# Patient Record
Sex: Female | Born: 1955 | Race: White | Hispanic: Yes | Marital: Married | Smoking: Never smoker
Health system: Southern US, Community
[De-identification: ages and names within clinical notes are randomized; demographics above are authoritative.]

## PROBLEM LIST (undated history)

## (undated) DIAGNOSIS — I10 Essential (primary) hypertension: Secondary | ICD-10-CM

## (undated) HISTORY — PX: CHOLECYSTECTOMY: SHX55

---

## 2003-11-06 ENCOUNTER — Ambulatory Visit: Payer: Self-pay | Admitting: Family Medicine

## 2005-03-08 ENCOUNTER — Ambulatory Visit: Payer: Self-pay | Admitting: Family Medicine

## 2006-04-05 ENCOUNTER — Ambulatory Visit: Payer: Self-pay | Admitting: Family Medicine

## 2007-09-26 ENCOUNTER — Ambulatory Visit: Payer: Self-pay | Admitting: Unknown Physician Specialty

## 2007-10-01 ENCOUNTER — Ambulatory Visit: Payer: Self-pay | Admitting: Surgery

## 2007-10-03 ENCOUNTER — Ambulatory Visit: Payer: Self-pay | Admitting: Surgery

## 2009-02-08 ENCOUNTER — Emergency Department: Payer: Self-pay | Admitting: Internal Medicine

## 2009-03-10 ENCOUNTER — Ambulatory Visit: Payer: Self-pay | Admitting: Family Medicine

## 2009-11-19 ENCOUNTER — Ambulatory Visit: Payer: Self-pay | Admitting: Family Medicine

## 2010-12-02 ENCOUNTER — Emergency Department: Payer: Self-pay | Admitting: Emergency Medicine

## 2010-12-20 ENCOUNTER — Ambulatory Visit: Payer: Self-pay | Admitting: Family Medicine

## 2011-07-18 ENCOUNTER — Ambulatory Visit: Payer: Self-pay | Admitting: Primary Care

## 2011-08-09 ENCOUNTER — Ambulatory Visit: Payer: Self-pay | Admitting: Orthopedic Surgery

## 2011-08-09 LAB — BASIC METABOLIC PANEL
BUN: 16 mg/dL (ref 7–18)
Creatinine: 0.71 mg/dL (ref 0.60–1.30)
EGFR (Non-African Amer.): >60
Glucose: 83 mg/dL (ref 65–99)
Osmolality: 285 (ref 275–301)
Potassium: 3.6 mmol/L (ref 3.5–5.1)
Sodium: 143 mmol/L (ref 136–145)

## 2011-08-11 ENCOUNTER — Ambulatory Visit: Payer: Self-pay | Admitting: Orthopedic Surgery

## 2011-12-21 ENCOUNTER — Ambulatory Visit: Payer: Self-pay | Admitting: Urology

## 2012-03-06 ENCOUNTER — Ambulatory Visit: Payer: Self-pay | Admitting: Primary Care

## 2012-05-04 ENCOUNTER — Emergency Department: Payer: Self-pay | Admitting: Emergency Medicine

## 2012-05-04 LAB — COMPREHENSIVE METABOLIC PANEL
Anion Gap: 9 (ref 7–16)
Bilirubin,Total: 0.3 mg/dL (ref 0.2–1.0)
Calcium, Total: 8.4 mg/dL — ABNORMAL LOW (ref 8.5–10.1)
Creatinine: 0.71 mg/dL (ref 0.60–1.30)
EGFR (African American): >60
EGFR (Non-African Amer.): >60
Glucose: 158 mg/dL — ABNORMAL HIGH (ref 65–99)
SGOT(AST): 32 U/L (ref 15–37)
Total Protein: 7.7 g/dL (ref 6.4–8.2)

## 2012-05-04 LAB — URINALYSIS, COMPLETE
Bilirubin,UR: NEGATIVE
Glucose,UR: NEGATIVE mg/dL (ref 0–75)
Ketone: NEGATIVE
Nitrite: NEGATIVE
Ph: 6 (ref 4.5–8.0)
RBC,UR: 1 /HPF (ref 0–5)
Specific Gravity: 1.014 (ref 1.003–1.030)
Squamous Epithelial: NONE SEEN

## 2012-05-04 LAB — CBC
HCT: 37.8 % (ref 35.0–47.0)
HGB: 12.6 g/dL (ref 12.0–16.0)
MCH: 28.7 pg (ref 26.0–34.0)
MCHC: 33.4 g/dL (ref 32.0–36.0)
Platelet: 280 10*3/uL (ref 150–440)
RBC: 4.39 10*6/uL (ref 3.80–5.20)

## 2012-07-23 ENCOUNTER — Ambulatory Visit: Payer: Self-pay | Admitting: Internal Medicine

## 2012-11-15 ENCOUNTER — Ambulatory Visit: Payer: Self-pay

## 2013-03-07 ENCOUNTER — Ambulatory Visit: Payer: Self-pay | Admitting: Internal Medicine

## 2014-04-02 ENCOUNTER — Emergency Department: Payer: Self-pay | Admitting: Emergency Medicine

## 2014-04-09 ENCOUNTER — Ambulatory Visit: Payer: Self-pay | Admitting: Internal Medicine

## 2014-05-25 NOTE — Op Note (Signed)
PATIENT NAME:  Abigail Freeman, Abigail Freeman MR#:  [id number] DATE OF BIRTH:  03/28/1955  DATE OF PROCEDURE:  08/11/2011  PREOPERATIVE DIAGNOSIS: Medial and lateral meniscus tears, right knee.   POSTOPERATIVE DIAGNOSIS:  Medial and lateral meniscus tears, right knee.   PROCEDURES:  1. Arthroscopy, right knee.  2. Partial medial and lateral meniscectomies.   SURGEON: Leitha SchullerMichael J. Cadi Rhinehart, MD.   ANESTHESIA: General.   DESCRIPTION OF PROCEDURE: The patient was brought to the Operating Room and after adequate anesthesia was obtained, the right knee was placed in the arthroscopic legholder with tourniquet applied but not required during the procedure. After prepping and draping in the usual sterile fashion, appropriate patient identification and time-out procedures were completed. An inferolateral portal was made and the arthroscope was introduced. Initial inspection revealed relatively normal appearing patellofemoral articulation coming around medially, but there was a small amount of chondromalacia, but minimal, at the patellofemoral joint with good patellofemoral tracking. Along the medial gutter, there were no loose bodies. Coming in the medial compartment, an inferomedial portal was made and a probe introduced. There was a very large flap tear of the posterior third of the meniscus with also a horizontal tear into the body of the posterior third. This was subsequently debrided with a meniscal punch and ArthroCare wand to get back to a stable margin. Articular cartilage showed just some mild fibrillation, but overall intact articular cartilage. The anterior cruciate ligament was intact. Going to the lateral compartment, the lateral compartment appeared normal except for a tear at the anterior horn of the lateral meniscus close to its attachment close to the notch. An ArthroCare wand was used to address this leaving the attachment centrally to prevent instability of the anterior horn of the lateral meniscus. The gutters  were checked and after addressing all meniscal pathology, the knee was thoroughly irrigated. All instrumentation was withdrawn. The wounds were closed in simple interrupted 4-0 nylon single suture to each portal. 20 mL of 0.5% Sensorcaine with epinephrine was infiltrated in the portals. Sterile dressing of Xeroform, 4 x 4's, Webril, and Ace wrap were applied. The patient was sent to the recovery room in stable condition.   ESTIMATED BLOOD LOSS: Minimal.  COMPLICATIONS: None.  SPECIMENS: None. Intraoperative pictures obtained.   ____________________________ Leitha SchullerMichael J. Anhar Mcdermott, MD mjm:ap D: 08/11/2011 20:00:10 ET T: 08/12/2011 09:32:59 ET JOB#: [id number]  cc: Leitha SchullerMichael J. Amala Petion, MD, <Dictator> Leitha SchullerMICHAEL J Shatarra Wehling MD ELECTRONICALLY SIGNED 08/12/2011 10:34

## 2014-09-20 ENCOUNTER — Encounter: Payer: Self-pay | Admitting: Emergency Medicine

## 2014-09-20 ENCOUNTER — Emergency Department
Admission: EM | Admit: 2014-09-20 | Discharge: 2014-09-20 | Disposition: A | Discharge status: Home / Self Care | Payer: BLUE CROSS/BLUE SHIELD | Attending: Emergency Medicine | Admitting: Emergency Medicine

## 2014-09-20 ENCOUNTER — Emergency Department: Payer: BLUE CROSS/BLUE SHIELD

## 2014-09-20 DIAGNOSIS — R0600 Dyspnea, unspecified: Secondary | ICD-10-CM | POA: Insufficient documentation

## 2014-09-20 DIAGNOSIS — I10 Essential (primary) hypertension: Secondary | ICD-10-CM | POA: Diagnosis not present

## 2014-09-20 DIAGNOSIS — F419 Anxiety disorder, unspecified: Secondary | ICD-10-CM | POA: Diagnosis not present

## 2014-09-20 DIAGNOSIS — R0602 Shortness of breath: Secondary | ICD-10-CM | POA: Diagnosis present

## 2014-09-20 HISTORY — DX: Essential (primary) hypertension: I10

## 2014-09-20 NOTE — ED Notes (Signed)
This is not a worker's comp - the pt feels sob - spO2 98%. The cleaning supplies she uses at home is windex and comet cleanerrg

## 2014-09-20 NOTE — ED Provider Notes (Signed)
[facility] Emergency Department Provider Note  ____________________________________________  Time seen: Approximately 2:28 PM  I have reviewed the triage vital signs and the nursing notes.   HISTORY  Chief Complaint Shortness of Breath    HPI Abigail Freeman is a 59 y.o. female via interpreter patient complain of shortness of breath for 5 days. Patient stated this is ongoing intermitting complaint which has not been reported to her family doctor. Patient states she's getting intermittent episodes of dyspnea and increased heart rate. Patient also states this onset seemed be related to the death of her friend's husband last week.Patient state is exertional and nonexertional. 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 No palliative measures for this complaint.  onal.  Past Medical History  Diagnosis Date  . Hypertension     There are no active problems to display for this patient.   Past Surgical History  Procedure Laterality Date  . Cholecystectomy      No current outpatient prescriptions on file.  Allergies Review of patient's allergies indicates no known allergies.  No family history on file.  Social History Social History  Substance Use Topics  . Smoking status: Never Smoker   . Smokeless tobacco: None  . Alcohol Use: No    Review of Systems Constitutional: No fever/chills Eyes: No visual changes. ENT: No sore throat. Cardiovascular: Denies chest pain. Respiratory: Denies shortness of breath. Gastrointestinal: No abdominal pain.  No nausea, no vomiting.  No diarrhea.  No constipation. Genitourinary: Negative for dysuria. Musculoskeletal: Negative for back pain. Skin: Negative for rash. Neurological: Negative for headaches, focal weakness or numbness.  10-point ROS otherwise negative.  ____________________________________________   PHYSICAL EXAM:  VITAL SIGNS: ED Triage Vitals  Enc Vitals Group      BP 09/20/14 1349 166/89 mmHg     Pulse Rate 09/20/14 1349 104     Resp 09/20/14 1349 20     Temp 09/20/14 1349 98.5 F (36.9 C)     Temp Source 09/20/14 1349 Oral     SpO2 09/20/14 1349 98 %     Weight 09/20/14 1349 160 lb (72.576 kg)     Height 09/20/14 1349 5' (1.524 m)     Head Cir --      Peak Flow --      Pain Score --      Pain Loc --      Pain Edu? --      Excl. in [place]? --     Constitutional: Alert and oriented. Well appearing and in no acute distress. Eyes: Conjunctivae are normal. PERRL. EOMI. Head: Atraumatic. Nose: No congestion/rhinnorhea. Mouth/Throat: Mucous membranes are moist.  Oropharynx non-erythematous. Neck: No stridor. No cervical spine tenderness to palpation. Hematological/Lymphatic/Immunilogical: No cervical lymphadenopathy. Cardiovascular: Normal rate, regular rhythm. Grossly normal heart sounds.  Good peripheral circulation. Respiratory: Normal respiratory effort.  No retractions. Lungs CTAB. Gastrointestinal: Soft and nontender. No distention. No abdominal bruits. No CVA tenderness. Musculoskeletal: No lower extremity tenderness nor edema.  No joint effusions. Neurologic:  Normal speech and language. No gross focal neurologic deficits are appreciated. No gait instability. Skin:  Skin is warm, dry and intact. No rash noted. Psychiatric: Mood and affect are normal. Speech and behavior are normal.  ____________________________________________   LABS (all labs ordered are listed, but only abnormal results are displayed)  Labs Reviewed - No data to display ____________________________________________  EKG   ____________________________________________  RADIOLOGY  No acute finding on chest x-ray. ____________________________________________  PROCEDURES  Procedure(s) performed: None  Critical Care performed: No  ____________________________________________   INITIAL IMPRESSION / ASSESSMENT AND PLAN / ED COURSE  Pertinent labs &  imaging results that were available during my care of the patient were reviewed by me and considered in my medical decision making (see chart for details).  Dyspnea secondary to anxiety. Patient advised to follow-up family doctor in the next 3 days for complete evaluation of her complaint. Discussed negative chest x-ray findings today. Patient denies any shortness of breath or feel anxious at this time. ____________________________________________   FINAL CLINICAL IMPRESSION(S) / ED DIAGNOSES  Final diagnoses:  None      Joni Reining, PA-C 09/20/14 1503  Minna Antis, MD 09/20/14 [phone]

## 2014-09-20 NOTE — ED Notes (Signed)
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

## 2015-04-24 ENCOUNTER — Other Ambulatory Visit: Payer: Self-pay | Admitting: Internal Medicine

## 2015-04-24 DIAGNOSIS — Z1231 Encounter for screening mammogram for malignant neoplasm of breast: Secondary | ICD-10-CM

## 2015-05-08 ENCOUNTER — Ambulatory Visit
Admission: RE | Admit: 2015-05-08 | Discharge: 2015-05-08 | Disposition: A | Discharge status: Home / Self Care | Payer: BLUE CROSS/BLUE SHIELD | Source: Ambulatory Visit | Attending: Internal Medicine | Admitting: Internal Medicine

## 2015-05-08 DIAGNOSIS — Z1231 Encounter for screening mammogram for malignant neoplasm of breast: Secondary | ICD-10-CM | POA: Diagnosis present

## 2016-04-21 ENCOUNTER — Other Ambulatory Visit: Payer: Self-pay | Admitting: Internal Medicine

## 2016-04-21 DIAGNOSIS — Z1231 Encounter for screening mammogram for malignant neoplasm of breast: Secondary | ICD-10-CM

## 2016-05-18 ENCOUNTER — Ambulatory Visit
Admission: RE | Admit: 2016-05-18 | Discharge: 2016-05-18 | Disposition: A | Discharge status: Home / Self Care | Payer: BLUE CROSS/BLUE SHIELD | Source: Ambulatory Visit | Attending: Internal Medicine | Admitting: Internal Medicine

## 2016-05-18 DIAGNOSIS — Z1231 Encounter for screening mammogram for malignant neoplasm of breast: Secondary | ICD-10-CM

## 2016-08-18 ENCOUNTER — Other Ambulatory Visit: Payer: Self-pay | Admitting: Internal Medicine

## 2016-08-18 DIAGNOSIS — R739 Hyperglycemia, unspecified: Secondary | ICD-10-CM

## 2016-08-18 DIAGNOSIS — R748 Abnormal levels of other serum enzymes: Secondary | ICD-10-CM

## 2016-09-01 ENCOUNTER — Ambulatory Visit
Admission: RE | Admit: 2016-09-01 | Discharge: 2016-09-01 | Disposition: A | Discharge status: Home / Self Care | Payer: BLUE CROSS/BLUE SHIELD | Source: Ambulatory Visit | Attending: Internal Medicine | Admitting: Internal Medicine

## 2016-09-01 DIAGNOSIS — R748 Abnormal levels of other serum enzymes: Secondary | ICD-10-CM | POA: Diagnosis not present

## 2016-09-01 DIAGNOSIS — R739 Hyperglycemia, unspecified: Secondary | ICD-10-CM | POA: Diagnosis present

## 2016-09-01 MED ORDER — IOPAMIDOL (ISOVUE-300) INJECTION 61%
100.0000 mL | Freq: Once | INTRAVENOUS | Status: AC | PRN
  Administered 2016-09-01: 100 mL via INTRAVENOUS

## 2017-04-17 ENCOUNTER — Other Ambulatory Visit: Payer: Self-pay | Admitting: Internal Medicine

## 2017-04-17 DIAGNOSIS — Z1231 Encounter for screening mammogram for malignant neoplasm of breast: Secondary | ICD-10-CM

## 2017-05-23 ENCOUNTER — Ambulatory Visit
Admission: RE | Admit: 2017-05-23 | Discharge: 2017-05-23 | Disposition: A | Discharge status: Home / Self Care | Payer: BLUE CROSS/BLUE SHIELD | Source: Ambulatory Visit | Attending: Internal Medicine | Admitting: Internal Medicine

## 2017-05-23 DIAGNOSIS — R928 Other abnormal and inconclusive findings on diagnostic imaging of breast: Secondary | ICD-10-CM | POA: Insufficient documentation

## 2017-05-23 DIAGNOSIS — Z1231 Encounter for screening mammogram for malignant neoplasm of breast: Secondary | ICD-10-CM | POA: Diagnosis not present

## 2017-05-26 ENCOUNTER — Other Ambulatory Visit: Payer: Self-pay | Admitting: Internal Medicine

## 2017-05-26 DIAGNOSIS — R928 Other abnormal and inconclusive findings on diagnostic imaging of breast: Secondary | ICD-10-CM

## 2017-05-26 DIAGNOSIS — N6489 Other specified disorders of breast: Secondary | ICD-10-CM

## 2017-06-06 ENCOUNTER — Ambulatory Visit
Admission: RE | Admit: 2017-06-06 | Discharge: 2017-06-06 | Disposition: A | Discharge status: Home / Self Care | Payer: BLUE CROSS/BLUE SHIELD | Source: Ambulatory Visit | Attending: Internal Medicine | Admitting: Internal Medicine

## 2017-06-06 DIAGNOSIS — N6489 Other specified disorders of breast: Secondary | ICD-10-CM | POA: Insufficient documentation

## 2017-06-06 DIAGNOSIS — R928 Other abnormal and inconclusive findings on diagnostic imaging of breast: Secondary | ICD-10-CM

## 2017-06-09 ENCOUNTER — Other Ambulatory Visit: Payer: Self-pay | Admitting: Internal Medicine

## 2017-06-09 DIAGNOSIS — N641 Fat necrosis of breast: Secondary | ICD-10-CM

## 2018-01-08 ENCOUNTER — Ambulatory Visit
Admission: RE | Admit: 2018-01-08 | Discharge: 2018-01-08 | Disposition: A | Discharge status: Home / Self Care | Payer: BLUE CROSS/BLUE SHIELD | Source: Ambulatory Visit | Attending: Internal Medicine | Admitting: Internal Medicine

## 2018-01-08 ENCOUNTER — Other Ambulatory Visit: Payer: Self-pay | Admitting: Internal Medicine

## 2018-01-08 DIAGNOSIS — N641 Fat necrosis of breast: Secondary | ICD-10-CM

## 2018-03-27 ENCOUNTER — Other Ambulatory Visit: Payer: Self-pay | Admitting: Internal Medicine

## 2018-03-27 DIAGNOSIS — Z1231 Encounter for screening mammogram for malignant neoplasm of breast: Secondary | ICD-10-CM

## 2018-07-31 ENCOUNTER — Other Ambulatory Visit: Payer: Self-pay

## 2018-07-31 ENCOUNTER — Ambulatory Visit
Admission: RE | Admit: 2018-07-31 | Discharge: 2018-07-31 | Disposition: A | Discharge status: Home / Self Care | Payer: BC Managed Care – PPO | Source: Ambulatory Visit | Attending: Internal Medicine | Admitting: Internal Medicine

## 2018-07-31 DIAGNOSIS — Z1231 Encounter for screening mammogram for malignant neoplasm of breast: Secondary | ICD-10-CM | POA: Diagnosis not present

## 2018-08-24 ENCOUNTER — Other Ambulatory Visit: Payer: Self-pay

## 2018-08-24 DIAGNOSIS — Z20822 Contact with and (suspected) exposure to covid-19: Secondary | ICD-10-CM

## 2018-08-27 LAB — NOVEL CORONAVIRUS, NAA: SARS-CoV-2, NAA: NOT DETECTED

## 2018-08-30 ENCOUNTER — Other Ambulatory Visit: Payer: Self-pay

## 2018-12-14 ENCOUNTER — Other Ambulatory Visit: Payer: Self-pay

## 2018-12-14 ENCOUNTER — Inpatient Hospital Stay
Admission: EM | Admit: 2018-12-14 | Discharge: 2018-12-16 | DRG: 872 | Disposition: A | Discharge status: Home / Self Care | Payer: BC Managed Care – PPO | Attending: Internal Medicine | Admitting: Internal Medicine

## 2018-12-14 ENCOUNTER — Encounter: Payer: Self-pay | Admitting: Emergency Medicine

## 2018-12-14 ENCOUNTER — Inpatient Hospital Stay: Payer: BC Managed Care – PPO

## 2018-12-14 DIAGNOSIS — Z20828 Contact with and (suspected) exposure to other viral communicable diseases: Secondary | ICD-10-CM | POA: Diagnosis present

## 2018-12-14 DIAGNOSIS — R109 Unspecified abdominal pain: Secondary | ICD-10-CM

## 2018-12-14 DIAGNOSIS — N12 Tubulo-interstitial nephritis, not specified as acute or chronic: Secondary | ICD-10-CM | POA: Diagnosis not present

## 2018-12-14 DIAGNOSIS — Z8249 Family history of ischemic heart disease and other diseases of the circulatory system: Secondary | ICD-10-CM

## 2018-12-14 DIAGNOSIS — Z9049 Acquired absence of other specified parts of digestive tract: Secondary | ICD-10-CM

## 2018-12-14 DIAGNOSIS — R519 Headache, unspecified: Secondary | ICD-10-CM | POA: Diagnosis present

## 2018-12-14 DIAGNOSIS — E876 Hypokalemia: Secondary | ICD-10-CM | POA: Diagnosis present

## 2018-12-14 DIAGNOSIS — Z1612 Extended spectrum beta lactamase (ESBL) resistance: Secondary | ICD-10-CM | POA: Diagnosis present

## 2018-12-14 DIAGNOSIS — R7301 Impaired fasting glucose: Secondary | ICD-10-CM | POA: Diagnosis present

## 2018-12-14 DIAGNOSIS — I10 Essential (primary) hypertension: Secondary | ICD-10-CM | POA: Diagnosis present

## 2018-12-14 DIAGNOSIS — K5900 Constipation, unspecified: Secondary | ICD-10-CM | POA: Diagnosis present

## 2018-12-14 DIAGNOSIS — A419 Sepsis, unspecified organism: Secondary | ICD-10-CM | POA: Diagnosis present

## 2018-12-14 DIAGNOSIS — Z79899 Other long term (current) drug therapy: Secondary | ICD-10-CM

## 2018-12-14 DIAGNOSIS — N390 Urinary tract infection, site not specified: Secondary | ICD-10-CM

## 2018-12-14 DIAGNOSIS — Z791 Long term (current) use of non-steroidal anti-inflammatories (NSAID): Secondary | ICD-10-CM

## 2018-12-14 DIAGNOSIS — Z87440 Personal history of urinary (tract) infections: Secondary | ICD-10-CM | POA: Diagnosis not present

## 2018-12-14 DIAGNOSIS — Z800 Family history of malignant neoplasm of digestive organs: Secondary | ICD-10-CM | POA: Diagnosis not present

## 2018-12-14 DIAGNOSIS — A4151 Sepsis due to Escherichia coli [E. coli]: Secondary | ICD-10-CM | POA: Diagnosis not present

## 2018-12-14 LAB — CBC WITH DIFFERENTIAL/PLATELET
Abs Immature Granulocytes: 0.05 10*3/uL (ref 0.00–0.07)
Basophils Absolute: 0 10*3/uL (ref 0.0–0.1)
Basophils Relative: 0 %
Eosinophils Absolute: 0 10*3/uL (ref 0.0–0.5)
Eosinophils Relative: 0 %
HCT: 36.4 % (ref 36.0–46.0)
Hemoglobin: 12.1 g/dL (ref 12.0–15.0)
Immature Granulocytes: 0 %
Lymphocytes Relative: 3 %
Lymphs Abs: 0.4 10*3/uL — ABNORMAL LOW (ref 0.7–4.0)
MCH: 28.1 pg (ref 26.0–34.0)
MCHC: 33.2 g/dL (ref 30.0–36.0)
MCV: 84.7 fL (ref 80.0–100.0)
Monocytes Absolute: 0.8 10*3/uL (ref 0.1–1.0)
Monocytes Relative: 6 %
Neutro Abs: 11.5 10*3/uL — ABNORMAL HIGH (ref 1.7–7.7)
Neutrophils Relative %: 91 %
Platelets: 264 10*3/uL (ref 150–400)
RBC: 4.3 MIL/uL (ref 3.87–5.11)
RDW: 14.4 % (ref 11.5–15.5)
WBC: 12.8 10*3/uL — ABNORMAL HIGH (ref 4.0–10.5)
nRBC: 0 % (ref 0.0–0.2)

## 2018-12-14 LAB — URINALYSIS, COMPLETE (UACMP) WITH MICROSCOPIC
Bilirubin Urine: NEGATIVE
Glucose, UA: NEGATIVE mg/dL
Hgb urine dipstick: NEGATIVE
Ketones, ur: 5 mg/dL — AB
Nitrite: NEGATIVE
Protein, ur: NEGATIVE mg/dL
Specific Gravity, Urine: 1.011 (ref 1.005–1.030)
WBC, UA: >50 WBC/hpf — ABNORMAL HIGH (ref 0–5)
pH: 6 (ref 5.0–8.0)

## 2018-12-14 LAB — COMPREHENSIVE METABOLIC PANEL
ALT: 31 U/L (ref 0–44)
AST: 38 U/L (ref 15–41)
Albumin: 3.5 g/dL (ref 3.5–5.0)
Alkaline Phosphatase: 183 U/L — ABNORMAL HIGH (ref 38–126)
Anion gap: 12 (ref 5–15)
BUN: 20 mg/dL (ref 8–23)
CO2: 24 mmol/L (ref 22–32)
Calcium: 8.4 mg/dL — ABNORMAL LOW (ref 8.9–10.3)
Chloride: 99 mmol/L (ref 98–111)
Creatinine, Ser: 0.97 mg/dL (ref 0.44–1.00)
GFR calc Af Amer: >60 mL/min (ref >60)
GFR calc non Af Amer: >60 mL/min (ref >60)
Glucose, Bld: 146 mg/dL — ABNORMAL HIGH (ref 70–99)
Potassium: 3.1 mmol/L — ABNORMAL LOW (ref 3.5–5.1)
Sodium: 135 mmol/L (ref 135–145)
Total Bilirubin: 1.3 mg/dL — ABNORMAL HIGH (ref 0.3–1.2)
Total Protein: 7.8 g/dL (ref 6.5–8.1)

## 2018-12-14 LAB — MAGNESIUM: Magnesium: 2.1 mg/dL (ref 1.7–2.4)

## 2018-12-14 LAB — LACTIC ACID, PLASMA
Lactic Acid, Venous: 2.1 mmol/L (ref 0.5–1.9)
Lactic Acid, Venous: 1 mmol/L (ref 0.5–1.9)

## 2018-12-14 MED ORDER — SODIUM CHLORIDE 0.9 % IV BOLUS (SEPSIS)
500.0000 mL | Freq: Once | INTRAVENOUS | Status: AC
  Administered 2018-12-14: 17:00:00 500 mL via INTRAVENOUS

## 2018-12-14 MED ORDER — SODIUM CHLORIDE 0.9 % IV BOLUS (SEPSIS)
1000.0000 mL | Freq: Once | INTRAVENOUS | Status: AC
  Administered 2018-12-14: 17:00:00 1000 mL via INTRAVENOUS

## 2018-12-14 MED ORDER — SODIUM CHLORIDE 0.9 % IV SOLN
1.0000 g | Freq: Three times a day (TID) | INTRAVENOUS | Status: DC
  Administered 2018-12-15 – 2018-12-16 (×4): 1 g via INTRAVENOUS
  Filled 2018-12-14 (×7): qty 1

## 2018-12-14 MED ORDER — ACETAMINOPHEN 325 MG PO TABS
650.0000 mg | ORAL_TABLET | Freq: Four times a day (QID) | ORAL | Status: DC | PRN
  Administered 2018-12-14 – 2018-12-15 (×3): 650 mg via ORAL
  Filled 2018-12-14 (×3): qty 2

## 2018-12-14 MED ORDER — POTASSIUM CHLORIDE 20 MEQ/15ML (10%) PO SOLN
40.0000 meq | Freq: Once | ORAL | Status: AC
  Administered 2018-12-14: 22:00:00 40 meq via ORAL
  Filled 2018-12-14: qty 30

## 2018-12-14 MED ORDER — ACETAMINOPHEN 650 MG RE SUPP: 650.0000 mg | Freq: Four times a day (QID) | RECTAL | Status: DC | PRN

## 2018-12-14 MED ORDER — SODIUM CHLORIDE 0.9 % IV BOLUS (SEPSIS): 1000.0000 mL | Freq: Once | INTRAVENOUS | Status: DC

## 2018-12-14 MED ORDER — SODIUM CHLORIDE 0.9 % IV SOLN
1.0000 g | Freq: Once | INTRAVENOUS | Status: AC
  Administered 2018-12-14: 18:00:00 1 g via INTRAVENOUS
  Filled 2018-12-14: qty 1

## 2018-12-14 MED ORDER — POTASSIUM CHLORIDE IN NACL 20-0.9 MEQ/L-% IV SOLN
INTRAVENOUS | Status: AC
  Administered 2018-12-14 – 2018-12-15 (×2): via INTRAVENOUS
  Filled 2018-12-14 (×2): qty 1000

## 2018-12-14 MED ORDER — LISINOPRIL 10 MG PO TABS
10.0000 mg | ORAL_TABLET | Freq: Every day | ORAL | Status: DC
  Administered 2018-12-15 – 2018-12-16 (×2): 10 mg via ORAL
  Filled 2018-12-14 (×2): qty 1

## 2018-12-14 MED ORDER — ONDANSETRON HCL 4 MG PO TABS
4.0000 mg | ORAL_TABLET | Freq: Four times a day (QID) | ORAL | Status: DC | PRN
  Filled 2018-12-14: qty 1

## 2018-12-14 MED ORDER — ENOXAPARIN SODIUM 40 MG/0.4ML ~~LOC~~ SOLN
40.0000 mg | SUBCUTANEOUS | Status: DC
  Administered 2018-12-15 – 2018-12-16 (×2): 40 mg via SUBCUTANEOUS
  Filled 2018-12-14 (×3): qty 0.4

## 2018-12-14 MED ORDER — SODIUM CHLORIDE 0.9 % IV BOLUS
1000.0000 mL | Freq: Once | INTRAVENOUS | Status: AC
  Administered 2018-12-14: 13:00:00 1000 mL via INTRAVENOUS

## 2018-12-14 MED ORDER — ONDANSETRON HCL 4 MG/2ML IJ SOLN: 4.0000 mg | Freq: Four times a day (QID) | INTRAMUSCULAR | Status: DC | PRN

## 2018-12-14 NOTE — ED Provider Notes (Signed)
[facility] Emergency Department Provider Note  ____________________________________________   First MD Initiated Contact with Patient 12/14/18 1608     (approximate)  I have reviewed the triage vital signs and the nursing notes.   HISTORY  Chief Complaint Fever    HPI Abigail Freeman is a 63 y.o. female with past medical history of hypertension here with fever, weakness.  Patient states that for the last month or so, she has had persistent dysuria and burning with urination.  Over the last 24 hours, she has developed fever, chills, nausea, and left flank pain.  She went to her doctor 2 days ago for the symptoms and was prescribed ciprofloxacin.  She was called today and told to switch to Macrobid.  However, when she told them about the fever and chills, she was told to come to the ED.  She has a history of recurrent UTIs.  Denies history of kidney stones.  The pain began gradually, not severely.  Denies any blood in her urine.  No cough.  No known sick contacts.  No specific alleviating or aggravating factors.        Past Medical History:  Diagnosis Date  . Hypertension     Patient Active Problem List   Diagnosis Date Noted  . Sepsis due to urinary tract infection (HCC) 12/14/2018  . Hypertension   . Hypokalemia     Past Surgical History:  Procedure Laterality Date  . CHOLECYSTECTOMY      Prior to Admission medications   Medication Sig Start Date End Date Taking? Authorizing Provider  hydrochlorothiazide (HYDRODIURIL) 12.5 MG tablet Take 12.5 mg by mouth daily. 09/28/18  Yes [provider]  ibuprofen (ADVIL) 200 MG tablet Take 400 mg by mouth every 6 (six) hours as needed.   Yes [provider]  lisinopril (ZESTRIL) 10 MG tablet Take 10 mg by mouth daily. 09/28/18  Yes [provider]  ondansetron (ZOFRAN) 4 MG tablet Take 4 mg by mouth 3 (three) times daily as needed for nausea. 12/13/18 01/03/19 Yes [provider]  fluticasone (FLONASE) 50 MCG/ACT nasal spray Place 2 sprays into both nostrils daily. 08/11/18   [provider]  nitrofurantoin, macrocrystal-monohydrate, (MACROBID) 100 MG capsule Take 100 mg by mouth 2 (two) times daily. 12/14/18   [provider]    Allergies Patient has no known allergies.  Family History  Problem Relation Age of Onset  . Hypertension Mother   . Stomach cancer Father   . Pancreatic cancer Brother   . Breast cancer Neg Hx     Social History Social History   Tobacco Use  . Smoking status: Never Smoker  . Smokeless tobacco: Never Used  Substance Use Topics  . Alcohol use: No  . Drug use: Not on file    Review of Systems  Review of Systems  Constitutional: Positive for chills and fatigue. Negative for fever.  HENT: Negative for congestion and sore throat.   Eyes: Negative for visual disturbance.  Respiratory: Negative for cough and shortness of breath.   Cardiovascular: Negative for chest pain.  Gastrointestinal: Positive for abdominal pain, nausea and vomiting. Negative for diarrhea.  Genitourinary: Positive for dysuria and flank pain.  Musculoskeletal: Negative for back pain and neck pain.  Skin: Negative for rash and wound.  Neurological: Negative for weakness.  All other systems reviewed and are negative.    ____________________________________________  PHYSICAL EXAM:      VITAL SIGNS: ED Triage Vitals [12/14/18 1317]  Enc Vitals  Group     BP 137/75     Pulse Rate (!) 134     Resp 18     Temp (!) 100.8 F (38.2 C)     Temp Source Oral     SpO2 96 %     Weight 170 lb (77.1 kg)     Height 5\' 2"  (1.575 m)     Head Circumference      Peak Flow      Pain Score 7     Pain Loc      Pain Edu?      Excl. in [place]?      Physical Exam Vitals signs and nursing note reviewed.  Constitutional:      General: She is not in acute distress.    Appearance: She is well-developed.  HENT:     Head: Normocephalic and  atraumatic.     Mouth/Throat:     Mouth: Mucous membranes are dry.  Eyes:     Conjunctiva/sclera: Conjunctivae normal.  Neck:     Musculoskeletal: Neck supple.  Cardiovascular:     Rate and Rhythm: Regular rhythm. Tachycardia present.     Heart sounds: Normal heart sounds. No murmur. No friction rub.  Pulmonary:     Effort: Pulmonary effort is normal. No respiratory distress.     Breath sounds: Normal breath sounds. No wheezing or rales.  Abdominal:     General: There is no distension.     Palpations: Abdomen is soft.     Tenderness: There is no abdominal tenderness.     Comments: No rebound or guarding, mild left flank tenderness, no overt CVA tenderness  Skin:    General: Skin is warm.     Capillary Refill: Capillary refill takes less than 2 seconds.  Neurological:     Mental Status: She is alert and oriented to person, place, and time.     Motor: No abnormal muscle tone.       ____________________________________________   LABS (all labs ordered are listed, but only abnormal results are displayed)  Labs Reviewed  LACTIC ACID, PLASMA - Abnormal; Notable for the following components:      Result Value   Lactic Acid, Venous 2.1 (*)    All other components within normal limits  COMPREHENSIVE METABOLIC PANEL - Abnormal; Notable for the following components:   Potassium 3.1 (*)    Glucose, Bld 146 (*)    Calcium 8.4 (*)    Alkaline Phosphatase 183 (*)    Total Bilirubin 1.3 (*)    All other components within normal limits  CBC WITH DIFFERENTIAL/PLATELET - Abnormal; Notable for the following components:   WBC 12.8 (*)    Neutro Abs 11.5 (*)    Lymphs Abs 0.4 (*)    All other components within normal limits  CULTURE, BLOOD (ROUTINE X 2)  CULTURE, BLOOD (ROUTINE X 2)  URINE CULTURE  MAGNESIUM  LACTIC ACID, PLASMA  URINALYSIS, COMPLETE (UACMP) WITH MICROSCOPIC  HIV ANTIBODY (ROUTINE TESTING W REFLEX)  CBC  COMPREHENSIVE METABOLIC PANEL     ____________________________________________  EKG: Sinus tachycardia, ventricular rate 135.  PR 140, QRS 82, QTc 564.  No acute ST elevations or depressions.  Nonspecific T wave changes, likely rate related. ________________________________________  RADIOLOGY All imaging, including plain films, CT scans, and ultrasounds, independently reviewed by me, and interpretations confirmed via formal radiology reads.  ED MD interpretation:   None  Official radiology report(s): No results found.  ____________________________________________  PROCEDURES   Procedure(s) performed (including  Critical Care):  .Critical Care Performed by: Shaune Pollack, MD Authorized by: Shaune Pollack, MD   Critical care provider statement:    Critical care time (minutes):  35   Critical care time was exclusive of:  Separately billable procedures and treating other patients and teaching time   Critical care was necessary to treat or prevent imminent or life-threatening deterioration of the following conditions:  Circulatory failure, sepsis and cardiac failure   Critical care was time spent personally by me on the following activities:  Development of treatment plan with patient or surrogate, discussions with consultants, evaluation of patient's response to treatment, examination of patient, obtaining history from patient or surrogate, ordering and performing treatments and interventions, ordering and review of laboratory studies, ordering and review of radiographic studies, pulse oximetry, re-evaluation of patient's condition and review of old charts   I assumed direction of critical care for this patient from another provider in my specialty: no      ____________________________________________  INITIAL IMPRESSION / MDM / ASSESSMENT AND PLAN / ED COURSE  As part of my medical decision making, I reviewed the following data within the electronic MEDICAL RECORD NUMBER Nursing notes reviewed and incorporated, Old  chart reviewed, Notes from prior ED visits, and [place] Controlled Substance Database       *Abigail Freeman was evaluated in Emergency Department on 12/14/2018 for the symptoms described in the history of present illness. She was evaluated in the context of the global COVID-19 pandemic, which necessitated consideration that the patient might be at risk for infection with the SARS-CoV-2 virus that causes COVID-19. Institutional protocols and algorithms that pertain to the evaluation of patients at risk for COVID-19 are in a state of rapid change based on information released by regulatory bodies including the CDC and federal and state organizations. These policies and algorithms were followed during the patient's care in the ED.  Some ED evaluations and interventions may be delayed as a result of limited staffing during the pandemic.*     Medical Decision Making: 63 year old female here with sepsis likely due to pyelonephritis from resistant E. coli.  Reviewed cultures from recent [facility] visit.  Discussed with pharmacy, patient started on empiric meropenem.  She was also started on sepsis protocol with fluids.  Lactic acid only mildly elevated at 2.1.  No significant flank pain to suggest infected stone.  Will admit for fluids, antibiotics, and monitoring.  ____________________________________________  FINAL CLINICAL IMPRESSION(S) / ED DIAGNOSES  Final diagnoses:  Sepsis due to urinary tract infection (HCC)  Pyelonephritis     MEDICATIONS GIVEN DURING THIS VISIT:  Medications  enoxaparin (LOVENOX) injection 40 mg (has no administration in time range)  0.9 % NaCl with KCl 20 mEq/ L  infusion (has no administration in time range)  acetaminophen (TYLENOL) tablet 650 mg (has no administration in time range)    Or  acetaminophen (TYLENOL) suppository 650 mg (has no administration in time range)  ondansetron (ZOFRAN) tablet 4 mg (has no administration in time range)    Or  ondansetron (ZOFRAN)  injection 4 mg (has no administration in time range)  potassium chloride 20 MEQ/15ML (10%) solution 40 mEq (has no administration in time range)  meropenem (MERREM) 1 g in sodium chloride 0.9 % 100 mL IVPB (has no administration in time range)  lisinopril (ZESTRIL) tablet 10 mg (has no administration in time range)  sodium chloride 0.9 % bolus 1,000 mL (0 mLs Intravenous Stopped 12/14/18 1635)  sodium chloride 0.9 % bolus 1,000 mL (1,000  mLs Intravenous New Bag/Given 12/14/18 1641)    And  sodium chloride 0.9 % bolus 500 mL (0 mLs Intravenous Stopped 12/14/18 1943)  meropenem (MERREM) 1 g in sodium chloride 0.9 % 100 mL IVPB (1 g Intravenous New Bag/Given 12/14/18 1731)     ED Discharge Orders    None       Note:  This document was prepared using Dragon voice recognition software and may include unintentional dictation errors.   Shaune PollackIsaacs, Shaleigh Laubscher, MD 12/14/18 [phone]

## 2018-12-14 NOTE — H&P (Signed)
History and Physical    Abigail Freeman TAE:[id number] DOB: September 19, 1955 DOA: 12/14/2018  PCP: Tracie Harrier, MD  Patient coming from: Home  I have personally briefly reviewed patient's old medical records in [place]  Chief Complaint: Dysuria, fevers, flank pain  HPI: Abigail Freeman is a 63 y.o. female with medical history significant for hypertension who presents to the ED for evaluation of persistent dysuria, fevers, and right-sided flank pain.  Patient is Spanish-speaking, communication is facilitated with use of [employer] video interpreter.  Patient has been having about 1 month of dysuria.  She saw her PCP on 12/12/2018 at which time urine studies were obtained and she was started on ciprofloxacin. Urinalysis as seen in care everywhere showed positive nitrites, moderate leukocytes, >50 WBCs, >50 RBCs, and moderate bacteria.  Urine culture resulting morning of 12/14/2018 grew out E. coli with multiple resistances including to ceftriaxone and ciprofloxacin.  She was called to switch antibiotics to [facility] however reported feeling fevers and chills and therefore was advised to present to the ED for further evaluation and management.  Patient otherwise denies any chest pain, dyspnea, cough, or diarrhea.  She denies any history of kidney stones.  Of note patient had a negative SARS-CoV-2 test from 12/13/2018.  ED Course:  Initial vitals showed BP 137/75, pulse 134, RR 18, temp 100.8 Fahrenheit, SPO2 96% on room air.  Labs are notable for potassium 3.1, sodium 135, bicarb 24, BUN 20, creatinine 0.97, alk phos 183, total bilirubin 1.3, AST 38, ALT 31, WBC 12.8, hemoglobin 12.1, platelets 264,000, lactic acid 2.1.  Blood cultures were obtained and pending.  Urinalysis and urine culture are ordered pending.  Patient was given 2.5 L normal saline and started on IV meropenem given prior urine culture sensitivities.  The hospitalist service was consulted for further evaluation management.   Review of Systems: All systems reviewed and are negative except as documented in history of present illness above.   Past Medical History:  Diagnosis Date  . Hypertension     Past Surgical History:  Procedure Laterality Date  . CHOLECYSTECTOMY      Social History:  reports that she has never smoked. She has never used smokeless tobacco. She reports that she does not drink alcohol. No history on file for drug.  No Known Allergies  Family History  Problem Relation Age of Onset  . Hypertension Mother   . Stomach cancer Father   . Pancreatic cancer Brother   . Breast cancer Neg Hx      Prior to Admission medications   Medication Sig Start Date End Date Taking? Authorizing Provider  hydrochlorothiazide (HYDRODIURIL) 12.5 MG tablet Take 12.5 mg by mouth daily. 09/28/18  Yes [provider]  ibuprofen (ADVIL) 200 MG tablet Take 400 mg by mouth every 6 (six) hours as needed.   Yes [provider]  lisinopril (ZESTRIL) 10 MG tablet Take 10 mg by mouth daily. 09/28/18  Yes [provider]  ondansetron (ZOFRAN) 4 MG tablet Take 4 mg by mouth 3 (three) times daily as needed for nausea. 12/13/18 01/03/19 Yes [provider]  ciprofloxacin (CIPRO) 500 MG tablet Take 500 mg by mouth 2 (two) times daily. 12/13/18   [provider]  fluticasone (FLONASE) 50 MCG/ACT nasal spray Place 2 sprays into both nostrils daily. 08/11/18   [provider]  nitrofurantoin, macrocrystal-monohydrate, (MACROBID) 100 MG capsule Take 100 mg by mouth 2 (two) times daily. 12/14/18   [provider]    Physical Exam: Vitals:  12/14/18 1317 12/14/18 1705 12/14/18 1730  BP: 137/75 124/76 129/76  Pulse: (!) 134  95  Resp: 18  18  Temp: (!) 100.8 F (38.2 C)    TempSrc: Oral    SpO2: 96%  95%  Weight: 77.1 kg    Height: '5\' 2"'  (1.575 m)      Constitutional: Resting supine in no apparent acute disposition in bed, NAD, calm, appears tired Eyes:  PERRL, lids and conjunctivae normal ENMT: Mucous membranes are moist. Posterior pharynx clear of any exudate or lesions.Normal dentition.  Neck: normal, supple, no masses. Respiratory: clear to auscultation bilaterally, no wheezing, no crackles. Normal respiratory effort. No accessory muscle use.  Cardiovascular: Tachycardic, no murmurs / rubs / gallops. No extremity edema. 2+ pedal pulses. Abdomen: no tenderness, no masses palpated. No hepatosplenomegaly. Bowel sounds positive.  No CVA tenderness. Musculoskeletal: no clubbing / cyanosis. No joint deformity upper and lower extremities. Good ROM, no contractures. Normal muscle tone.  Skin: Diaphoretic, no rashes, lesions, ulcers. No induration Neurologic: CN 2-12 grossly intact. Sensation intact, Strength 5/5 in all 4.  Psychiatric: Normal judgment and insight. Alert and oriented x 3. Normal mood.     Labs on Admission: I have personally reviewed following labs and imaging studies  CBC: Recent Labs  Lab 12/14/18 1329  WBC 12.8*  NEUTROABS 11.5*  HGB 12.1  HCT 36.4  MCV 84.7  PLT 854   Basic Metabolic Panel: Recent Labs  Lab 12/14/18 1329  NA 135  K 3.1*  CL 99  CO2 24  GLUCOSE 146*  BUN 20  CREATININE 0.97  CALCIUM 8.4*  MG 2.1   GFR: Estimated Creatinine Clearance: 56.3 mL/min (by C-G formula based on SCr of 0.97 mg/dL). Liver Function Tests: Recent Labs  Lab 12/14/18 1329  AST 38  ALT 31  ALKPHOS 183*  BILITOT 1.3*  PROT 7.8  ALBUMIN 3.5   No results for input(s): LIPASE, AMYLASE in the last 168 hours. No results for input(s): AMMONIA in the last 168 hours. Coagulation Profile: No results for input(s): INR, PROTIME in the last 168 hours. Cardiac Enzymes: No results for input(s): CKTOTAL, CKMB, CKMBINDEX, TROPONINI in the last 168 hours. BNP (last 3 results) No results for input(s): PROBNP in the last 8760 hours. HbA1C: No results for input(s): HGBA1C in the last 72 hours. CBG: No results for input(s):  GLUCAP in the last 168 hours. Lipid Profile: No results for input(s): CHOL, HDL, LDLCALC, TRIG, CHOLHDL, LDLDIRECT in the last 72 hours. Thyroid Function Tests: No results for input(s): TSH, T4TOTAL, FREET4, T3FREE, THYROIDAB in the last 72 hours. Anemia Panel: No results for input(s): VITAMINB12, FOLATE, FERRITIN, TIBC, IRON, RETICCTPCT in the last 72 hours. Urine analysis:    Component Value Date/Time   COLORURINE Straw 05/04/2012 0632   APPEARANCEUR Clear 05/04/2012 0632   LABSPEC 1.014 05/04/2012 0632   PHURINE 6.0 05/04/2012 0632   GLUCOSEU Negative 05/04/2012 0632   HGBUR Negative 05/04/2012 0632   BILIRUBINUR Negative 05/04/2012 0632   KETONESUR Negative 05/04/2012 0632   PROTEINUR Negative 05/04/2012 0632   NITRITE Negative 05/04/2012 0632   LEUKOCYTESUR Negative 05/04/2012 6270    Radiological Exams on Admission: No results found.  EKG: Independently reviewed. Sinus tachycardia, rate is faster when compared to prior from 2014.  Assessment/Plan Principal Problem:   Sepsis due to urinary tract infection ([place]) Active Problems:   Hypertension   Hypokalemia  Abigail Freeman is a 63 y.o. female with medical history significant for hypertension who is admitted with sepsis  due to E. coli UTI/pyelonephritis.  Sepsis due to E. coli UTI/pyelonephritis: Patient with fevers, tachycardia, leukocytosis on admission.  Suspect ESBL based on culture sensitivities and seen in care everywhere.  Will continue meropenem -Continue IV meropenem -Repeat urinalysis and urine culture pending -Follow blood cultures -Will check renal ultrasound to assess for nephrolithiasis -Continue IV fluid resuscitation overnight  Hypokalemia: We will replete orally and add potassium to IV fluids.  Magnesium is 2.1.  Hypertension: Continue lisinopril, will hold HCTZ with hypokalemia.   DVT prophylaxis: Lovenox Code Status: Full code, confirmed with patient Family Communication: Discussed with husband  at bedside Disposition Plan: Pending clinical pro Consults called: None Admission status: Inpatient for management of sepsis due to E. coli UTI resistant to multiple agents requiring IV antibiotic therapy, IV fluid resuscitation, and further cultural data.   Zada Finders MD [facility]  If 7PM-7AM, please contact night-coverage [email or web address]  12/14/2018, 7:47 PM

## 2018-12-14 NOTE — ED Triage Notes (Addendum)
Ronnald Collum, interpreter in triage to assist with triage at this time.   Pt presents to ED via POV with c/o fever, pt c/o fever since yesterday. Per pt seen at [facility] on Oct 16 for UTI, but continues to have some burning with urination. Max temp at home 103. Pt states took 2 advil 2 hrs PTA.   Pt noted to be tachycardic on arrival.

## 2018-12-14 NOTE — ED Notes (Signed)
Patient transported to Ultrasound 

## 2018-12-14 NOTE — Progress Notes (Signed)
CODE SEPSIS - PHARMACY COMMUNICATION  **Broad Spectrum Antibiotics should be administered within 1 hour of Sepsis diagnosis**  Time Code Sepsis Called/Page Received: 1610  Antibiotics Ordered: meropenem  Time of 1st antibiotic administration: 1731  Additional action taken by pharmacy: [place] Resident 12/14/2018  4:51 PM

## 2018-12-14 NOTE — Sepsis Progress Note (Signed)
Notified bedside nurse of need to draw repeat lactic acid. 

## 2018-12-14 NOTE — Consult Note (Addendum)
PHARMACY -  BRIEF ANTIBIOTIC NOTE   Pharmacy has received consult(s) for meropenem from an ED provider.  The patient's profile has been reviewed for ht/wt/allergies/indication/available labs. NKDA  Patient presenting to ED c/o fever. Recently seen at [facility] for UTI but continues to have burning with urination. 11/11 urine culture result from [facility] with ESBL E coli strain (meropenem susceptible). Per outside meds it appears patient may have been on ciprofloxacin which strain is resistant to.   One time order(s) placed for  -Meropenem 1 g   Further antibiotics/pharmacy consults should be ordered by admitting physician if indicated.                       Thank you, [place] Resident 12/14/2018  4:44 PM

## 2018-12-15 DIAGNOSIS — E876 Hypokalemia: Secondary | ICD-10-CM

## 2018-12-15 DIAGNOSIS — I10 Essential (primary) hypertension: Secondary | ICD-10-CM

## 2018-12-15 DIAGNOSIS — N12 Tubulo-interstitial nephritis, not specified as acute or chronic: Secondary | ICD-10-CM

## 2018-12-15 DIAGNOSIS — A4151 Sepsis due to Escherichia coli [E. coli]: Principal | ICD-10-CM

## 2018-12-15 LAB — COMPREHENSIVE METABOLIC PANEL
ALT: 26 U/L (ref 0–44)
AST: 28 U/L (ref 15–41)
Albumin: 2.8 g/dL — ABNORMAL LOW (ref 3.5–5.0)
Alkaline Phosphatase: 159 U/L — ABNORMAL HIGH (ref 38–126)
Anion gap: 6 (ref 5–15)
BUN: 15 mg/dL (ref 8–23)
CO2: 21 mmol/L — ABNORMAL LOW (ref 22–32)
Calcium: 7.4 mg/dL — ABNORMAL LOW (ref 8.9–10.3)
Chloride: 112 mmol/L — ABNORMAL HIGH (ref 98–111)
Creatinine, Ser: 0.69 mg/dL (ref 0.44–1.00)
GFR calc Af Amer: >60 mL/min (ref >60)
GFR calc non Af Amer: >60 mL/min (ref >60)
Glucose, Bld: 117 mg/dL — ABNORMAL HIGH (ref 70–99)
Potassium: 3.7 mmol/L (ref 3.5–5.1)
Sodium: 139 mmol/L (ref 135–145)
Total Bilirubin: 1.7 mg/dL — ABNORMAL HIGH (ref 0.3–1.2)
Total Protein: 6.5 g/dL (ref 6.5–8.1)

## 2018-12-15 LAB — CBC
HCT: 30.4 % — ABNORMAL LOW (ref 36.0–46.0)
Hemoglobin: 10 g/dL — ABNORMAL LOW (ref 12.0–15.0)
MCH: 28.1 pg (ref 26.0–34.0)
MCHC: 32.9 g/dL (ref 30.0–36.0)
MCV: 85.4 fL (ref 80.0–100.0)
Platelets: 233 10*3/uL (ref 150–400)
RBC: 3.56 MIL/uL — ABNORMAL LOW (ref 3.87–5.11)
RDW: 14.6 % (ref 11.5–15.5)
WBC: 10.3 10*3/uL (ref 4.0–10.5)
nRBC: 0 % (ref 0.0–0.2)

## 2018-12-15 LAB — HIV ANTIBODY (ROUTINE TESTING W REFLEX): HIV Screen 4th Generation wRfx: NONREACTIVE

## 2018-12-15 LAB — LACTIC ACID, PLASMA: Lactic Acid, Venous: 0.9 mmol/L (ref 0.5–1.9)

## 2018-12-15 LAB — SARS CORONAVIRUS 2 (TAT 6-24 HRS): SARS Coronavirus 2: NEGATIVE

## 2018-12-15 NOTE — Progress Notes (Signed)
Patient ID: Abigail Freeman, female   DOB: [birth year], 63 y.o.   MRN: [id number] [facility] PROGRESS NOTE  Kamie Korber ZCH:[id number] DOB: July 30, 1955 DOA: 12/14/2018 PCP: Tracie Harrier, MD  HPI/Subjective: Patient feeling okay.  Complains of some headache.  Complains of some right back pain.  Complains of burning on urination.  Objective: Vitals:   12/15/18 1153 12/15/18 1154  BP: 131/87 131/87  Pulse: 100 93  Resp:  16  Temp: 98.8 F (37.1 C) 98.8 F (37.1 C)  SpO2: 99% 99%    Intake/Output Summary (Last 24 hours) at 12/15/2018 1347 Last data filed at 12/15/2018 1141 Gross per 24 hour  Intake 1000 ml  Output 300 ml  Net 700 ml   Filed Weights   12/14/18 1317  Weight: 77.1 kg    ROS: Review of Systems  Constitutional: Negative for chills and fever.  Eyes: Negative for blurred vision.  Respiratory: Negative for cough and shortness of breath.   Cardiovascular: Negative for chest pain.  Gastrointestinal: Negative for abdominal pain, constipation, diarrhea, nausea and vomiting.  Genitourinary: Positive for dysuria and frequency.  Musculoskeletal: Negative for joint pain.  Neurological: Positive for headaches. Negative for dizziness.   Exam: Physical Exam  Constitutional: She is oriented to person, place, and time.  HENT:  Nose: No mucosal edema.  Mouth/Throat: No oropharyngeal exudate or posterior oropharyngeal edema.  Eyes: Pupils are equal, round, and reactive to light. Conjunctivae, EOM and lids are normal.  Neck: No JVD present. Carotid bruit is not present. No edema present. No thyroid mass and no thyromegaly present.  Cardiovascular: S1 normal and S2 normal. Exam reveals no gallop.  No murmur heard. Pulses:      Dorsalis pedis pulses are 2+ on the right side and 2+ on the left side.  Respiratory: No respiratory distress. She has no wheezes. She has no rhonchi. She has no rales.  GI: Soft. Bowel sounds are normal. There is no abdominal tenderness.   Genitourinary:    Genitourinary Comments: Right CVA tenderness   Musculoskeletal:     Right ankle: She exhibits no swelling.     Left ankle: She exhibits no swelling.  Lymphadenopathy:    She has no cervical adenopathy.  Neurological: She is alert and oriented to person, place, and time. No cranial nerve deficit.  Skin: Skin is warm. No rash noted. Nails show no clubbing.  Psychiatric: She has a normal mood and affect.      Data Reviewed: Basic Metabolic Panel: Recent Labs  Lab 12/14/18 1329 12/15/18 0853  NA 135 139  K 3.1* 3.7  CL 99 112*  CO2 24 21*  GLUCOSE 146* 117*  BUN 20 15  CREATININE 0.97 0.69  CALCIUM 8.4* 7.4*  MG 2.1  --    Liver Function Tests: Recent Labs  Lab 12/14/18 1329 12/15/18 0853  AST 38 28  ALT 31 26  ALKPHOS 183* 159*  BILITOT 1.3* 1.7*  PROT 7.8 6.5  ALBUMIN 3.5 2.8*   CBC: Recent Labs  Lab 12/14/18 1329 12/15/18 0853  WBC 12.8* 10.3  NEUTROABS 11.5*  --   HGB 12.1 10.0*  HCT 36.4 30.4*  MCV 84.7 85.4  PLT 264 233    Recent Results (from the past 240 hour(s))  Blood Culture (routine x 2)     Status: None (Preliminary result)   Collection Time: 12/14/18  1:29 PM   Specimen: BLOOD  Result Value Ref Range Status   Specimen Description BLOOD RAC  Final   Special Requests  Final    BOTTLES DRAWN AEROBIC AND ANAEROBIC Blood Culture adequate volume   Culture   Final    NO GROWTH < 24 HOURS Performed at [facility], [address] [place], [place] [address]    Report Status PENDING  Incomplete  Blood Culture (routine x 2)     Status: None (Preliminary result)   Collection Time: 12/14/18  4:49 PM   Specimen: BLOOD  Result Value Ref Range Status   Specimen Description BLOOD L FA  Final   Special Requests   Final    BOTTLES DRAWN AEROBIC AND ANAEROBIC Blood Culture results may not be optimal due to an excessive volume of blood received in culture bottles   Culture   Final    NO GROWTH < 24 HOURS Performed at  [facility], [address], [place], [place] [address]    Report Status PENDING  Incomplete     Studies: US Renal  Result Date: 12/14/2018 CLINICAL DATA:  Right flank pain EXAM: RENAL / URINARY TRACT ULTRASOUND COMPLETE COMPARISON:  CT 09/01/2016 FINDINGS: Right Kidney: Renal measurements: 11.4 x 4.9 x 4.6 cm = volume: 136 mL . Echogenicity within normal limits. No mass or hydronephrosis visualized. Left Kidney: Renal measurements: 9.9 x 5.7 x 4.8 cm = volume: 142 mL. Echogenicity within normal limits. No mass or hydronephrosis visualized. Bladder: Appears normal for degree of bladder distention. Other: None. IMPRESSION: Normal study. Electronically Signed   By: Charlett Nose M.D.   On: 12/14/2018 21:13    Scheduled Meds: . enoxaparin (LOVENOX) injection  40 mg Subcutaneous Q24H  . lisinopril  10 mg Oral Daily   Continuous Infusions: . meropenem (MERREM) IV 1 g (12/15/18 1248)    Assessment/Plan:  1. Sepsis with ESBL E. coli pyelonephritis.  Urine culture sent as outpatient grew out ESBL and patient was sent over to the emergency room.  Our urine culture still pending at this time.  Meropenem ordered.  Will reassess tomorrow on how she is doing.  Potentially I can do fosfomycin orally as outpatient. 2. Essential hypertension on lisinopril.  HCTZ on hold. 3. Hypokalemia replaced and IV fluids.  Patient tolerating diet.  Code Status:     Code Status Orders  (From admission, onward)         Start     Ordered   12/14/18 1943  Full code  Continuous     12/14/18 1943        Code Status History    This patient has a current code status but no historical code status.   Advance Care Planning Activity     Family Communication: Family at bedside Disposition Plan: To be determined  Antibiotics:  Meropenem  Time spent: 28 minutes.  Juan the nurse was at the bedside.  I used the translator service on the computer in the room.  Celines Femia [employer]  [facility]  [place]

## 2018-12-15 NOTE — Progress Notes (Signed)
Report called to [facility] on 2A.

## 2018-12-16 DIAGNOSIS — R7301 Impaired fasting glucose: Secondary | ICD-10-CM

## 2018-12-16 DIAGNOSIS — R519 Headache, unspecified: Secondary | ICD-10-CM

## 2018-12-16 LAB — URINE CULTURE: Culture: >=10000 — AB

## 2018-12-16 MED ORDER — POLYETHYLENE GLYCOL 3350 17 G PO PACK: 17.0000 g | PACK | Freq: Every day | ORAL | 0 refills | Status: DC | PRN

## 2018-12-16 MED ORDER — MAGNESIUM SULFATE 2 GM/50ML IV SOLN
2.0000 g | Freq: Once | INTRAVENOUS | Status: AC
  Administered 2018-12-16: 10:00:00 2 g via INTRAVENOUS
  Filled 2018-12-16: qty 50

## 2018-12-16 MED ORDER — FOSFOMYCIN TROMETHAMINE 3 G PO PACK
3.0000 g | PACK | ORAL | Status: DC
  Administered 2018-12-16: 10:00:00 3 g via ORAL
  Filled 2018-12-16: qty 3

## 2018-12-16 MED ORDER — POLYETHYLENE GLYCOL 3350 17 G PO PACK
17.0000 g | PACK | Freq: Every day | ORAL | Status: DC
  Administered 2018-12-16: 10:00:00 17 g via ORAL
  Filled 2018-12-16: qty 1

## 2018-12-16 MED ORDER — FOSFOMYCIN TROMETHAMINE 3 G PO PACK: 3.0000 g | PACK | ORAL | 0 refills | Status: AC

## 2018-12-16 NOTE — Discharge Summary (Signed)
[facility] NAME: Abigail Freeman    MR#:  [id number]  DATE OF BIRTH:  01/30/1956  DATE OF ADMISSION:  12/14/2018 ADMITTING PHYSICIAN: Lenore Cordia, MD  DATE OF DISCHARGE: 12/16/2018  PRIMARY CARE PHYSICIAN: Tracie Harrier, MD    ADMISSION DIAGNOSIS:  Pyelonephritis [N12] Flank pain [R10.9] Sepsis due to urinary tract infection ([place]) [A41.9, N39.0]  DISCHARGE DIAGNOSIS:  Principal Problem:   Sepsis due to urinary tract infection ([place]) Active Problems:   Hypertension   Hypokalemia   Sepsis due to Escherichia coli without acute organ dysfunction (HCC)   Pyelonephritis   SECONDARY DIAGNOSIS:   Past Medical History:  Diagnosis Date  . Hypertension     HOSPITAL COURSE:   1.  Sepsis secondary to ESBL E. coli.  This culture was done as outpatient.  Patient was referred into the emergency room.  Patient was started on IV meropenem. Case discussed with pharmacy and we will give fosfomycin 1 dose here.  This is dosed every other day her next dose will be until Tuesday and the final dose on Thursday.  Our urine culture in the hospital is still pending. 2.  Headache.  The patient was given some IV magnesium and Tylenol.  She was advised to drink some coffee because she usually drinks coffee at home. 3.  Essential hypertension can go back on lisinopril.  Hydrochlorothiazide on hold. 4.  Hypokalemia this was replaced and IV fluids.  Hydrochlorothiazide discontinued. 5.  Constipation patient wanted something for constipation so I prescribed some MiraLAX. 6.  Impaired fasting glucose.  Continue to follow as outpatient.  DISCHARGE CONDITIONS:   Satisfactory  CONSULTS OBTAINED:  None  DRUG ALLERGIES:  No Known Allergies  DISCHARGE MEDICATIONS:   Allergies as of 12/16/2018   No Known Allergies     Medication List    STOP taking these medications   hydrochlorothiazide 12.5 MG tablet Commonly known as: HYDRODIURIL    nitrofurantoin (macrocrystal-monohydrate) 100 MG capsule Commonly known as: MACROBID   ondansetron 4 MG tablet Commonly known as: ZOFRAN     TAKE these medications   fluticasone 50 MCG/ACT nasal spray Commonly known as: FLONASE Place 2 sprays into both nostrils daily.   fosfomycin 3 g Pack Commonly known as: MONUROL Take 3 g by mouth every other day for 2 days. Start taking on: December 18, 2018   ibuprofen 200 MG tablet Commonly known as: ADVIL Take 400 mg by mouth every 6 (six) hours as needed.   lisinopril 10 MG tablet Commonly known as: ZESTRIL Take 10 mg by mouth daily.   polyethylene glycol 17 g packet Commonly known as: MIRALAX / GLYCOLAX Take 17 g by mouth daily as needed for moderate constipation.        DISCHARGE INSTRUCTIONS:   Follow-up PMD 5 days  If you experience worsening of your admission symptoms, develop shortness of breath, life threatening emergency, suicidal or homicidal thoughts you must seek medical attention immediately by calling 911 or calling your MD immediately  if symptoms less severe.  You Must read complete instructions/literature along with all the possible adverse reactions/side effects for all the Medicines you take and that have been prescribed to you. Take any new Medicines after you have completely understood and accept all the possible adverse reactions/side effects.   Please note  You were cared for by a hospitalist during your hospital stay. If you have any questions about your discharge medications or the care you received while you  were in the hospital after you are discharged, you can call the unit and asked to speak with the hospitalist on call if the hospitalist that took care of you is not available. Once you are discharged, your primary care physician will handle any further medical issues. Please note that NO REFILLS for any discharge medications will be authorized once you are discharged, as it is imperative that you  return to your primary care physician (or establish a relationship with a primary care physician if you do not have one) for your aftercare needs so that they can reassess your need for medications and monitor your lab values.    Today   CHIEF COMPLAINT:   Chief Complaint  Patient presents with  . Fever    HISTORY OF PRESENT ILLNESS:  Abigail Freeman  is a 63 y.o. female came in with fever and urinary symptoms and a positive ESBL E. coli on urine culture as outpatient.   VITAL SIGNS:  Blood pressure 138/72, pulse (!) 103, temperature 98.6 F (37 C), temperature source Oral, resp. rate 16, height 5\' 2"  (1.575 m), weight 74.2 kg, SpO2 97 %.    PHYSICAL EXAMINATION:  GENERAL:  63 y.o.-year-old patient lying in the bed with no acute distress.  EYES: Pupils equal, round, reactive to light and accommodation. No scleral icterus. Extraocular muscles intact.  HEENT: Head atraumatic, normocephalic. Oropharynx and nasopharynx clear.  NECK:  Supple, no jugular venous distention. No thyroid enlargement, no tenderness.  LUNGS: Normal breath sounds bilaterally, no wheezing, rales,rhonchi or crepitation. No use of accessory muscles of respiration.  CARDIOVASCULAR: S1, S2 normal. No murmurs, rubs, or gallops.  ABDOMEN: Soft, non-tender, non-distended. Bowel sounds present. No organomegaly or mass.  EXTREMITIES: No pedal edema, cyanosis, or clubbing.  NEUROLOGIC: Cranial nerves II through XII are intact. Muscle strength 5/5 in all extremities. Sensation intact. Gait not checked.  PSYCHIATRIC: The patient is alert and oriented x 3.  SKIN: No obvious rash, lesion, or ulcer.   DATA REVIEW:   CBC Recent Labs  Lab 12/15/18 0853  WBC 10.3  HGB 10.0*  HCT 30.4*  PLT 233    Chemistries  Recent Labs  Lab 12/14/18 1329 12/15/18 0853  NA 135 139  K 3.1* 3.7  CL 99 112*  CO2 24 21*  GLUCOSE 146* 117*  BUN 20 15  CREATININE 0.97 0.69  CALCIUM 8.4* 7.4*  MG 2.1  --   AST 38 28  ALT 31 26   ALKPHOS 183* 159*  BILITOT 1.3* 1.7*     Microbiology Results  Results for orders placed or performed during the hospital encounter of 12/14/18  Blood Culture (routine x 2)     Status: None (Preliminary result)   Collection Time: 12/14/18  1:29 PM   Specimen: BLOOD  Result Value Ref Range Status   Specimen Description BLOOD RAC  Final   Special Requests   Final    BOTTLES DRAWN AEROBIC AND ANAEROBIC Blood Culture adequate volume   Culture   Final    NO GROWTH < 24 HOURS Performed at [facility], [address] [place], [place] [address]    Report Status PENDING  Incomplete  Urine culture     Status: Abnormal   Collection Time: 12/14/18  4:15 PM   Specimen: Urine, Random  Result Value Ref Range Status   Specimen Description   Final    URINE, RANDOM Performed at [facility], [address] [place], [place] [address]    Special Requests  Final    NONE Performed at [facility], [address], [place], [place] [address]    Culture (A)  Final    <10,000 COLONIES/mL >=100,000 COLONIES/mL Performed at [facility], [address], [place], [place] [address]    Report Status 12/16/2018 FINAL  Final  Blood Culture (routine x 2)     Status: None (Preliminary result)   Collection Time: 12/14/18  4:49 PM   Specimen: BLOOD  Result Value Ref Range Status   Specimen Description BLOOD L FA  Final   Special Requests   Final    BOTTLES DRAWN AEROBIC AND ANAEROBIC Blood Culture results may not be optimal due to an excessive volume of blood received in culture bottles   Culture   Final    NO GROWTH < 24 HOURS Performed at [facility], [address], [place], [place] [address]    Report Status PENDING  Incomplete  SARS CORONAVIRUS 2 (TAT 6-24 HRS) Nasopharyngeal Nasopharyngeal Swab     Status: None   Collection Time: 12/15/18  7:37 AM   Specimen: Nasopharyngeal Swab  Result Value Ref Range Status   SARS Coronavirus 2 NEGATIVE  NEGATIVE Final    Comment: (NOTE) SARS-CoV-2 target nucleic acids are NOT DETECTED. The SARS-CoV-2 RNA is generally detectable in upper and lower respiratory specimens during the acute phase of infection. Negative results do not preclude SARS-CoV-2 infection, do not rule out co-infections with other pathogens, and should not be used as the sole basis for treatment or other patient management decisions. Negative results must be combined with clinical observations, patient history, and epidemiological information. The expected result is Negative. Fact Sheet for Patients: HairSlick.no Fact Sheet for Healthcare Providers: quierodirigir.com This test is not yet approved or cleared by the [place] FDA and  has been authorized for detection and/or diagnosis of SARS-CoV-2 by FDA under an Emergency Use Authorization (EUA). This EUA will remain  in effect (meaning this test can be used) for the duration of the COVID-19 declaration under Section 56 4(b)(1) of the Act, 21 U.S.C. section 360bbb-3(b)(1), unless the authorization is terminated or revoked sooner. Performed at [facility], [address] [place], [place] [address]     RADIOLOGY:  US Renal  Result Date: 12/14/2018 CLINICAL DATA:  Right flank pain EXAM: RENAL / URINARY TRACT ULTRASOUND COMPLETE COMPARISON:  CT 09/01/2016 FINDINGS: Right Kidney: Renal measurements: 11.4 x 4.9 x 4.6 cm = volume: 136 mL . Echogenicity within normal limits. No mass or hydronephrosis visualized. Left Kidney: Renal measurements: 9.9 x 5.7 x 4.8 cm = volume: 142 mL. Echogenicity within normal limits. No mass or hydronephrosis visualized. Bladder: Appears normal for degree of bladder distention. Other: None. IMPRESSION: Normal study. Electronically Signed   By: Charlett Nose M.D.   On: 12/14/2018 21:13       Management plans discussed with the patient, family and they are in  agreement.  CODE STATUS:     Code Status Orders  (From admission, onward)         Start     Ordered   12/14/18 1943  Full code  Continuous     12/14/18 1943        Code Status History    This patient has a current code status but no historical code status.   Advance Care Planning Activity      TOTAL TIME TAKING CARE OF THIS PATIENT: 35 minutes.    Alford Highland M.D on 12/16/2018 at 12:58  PM  Between 7am to 6pm - Pager - [phone]  After 6pm go to [email or web address] - password [email or web address]  [facility]  CC: Primary care physician; Barbette Reichmann, MD

## 2018-12-16 NOTE — Progress Notes (Signed)
Discharge instructions reviewed with the patient using the hospital interpreter. IV's removed. Patient sent out via wheelchair to waiting car

## 2018-12-16 NOTE — Progress Notes (Signed)
Patient ID: Abigail Freeman   The patient was admitted to [facility] on 12/14/2018 and discharged 12/16/2018.  Please remain off work for 1 week.  Patient may return to work 12/24/2018.  Dr Loletha Grayer [facility]

## 2018-12-19 LAB — CULTURE, BLOOD (ROUTINE X 2)
Culture: NO GROWTH
Culture: NO GROWTH
Special Requests: ADEQUATE

## 2019-01-03 ENCOUNTER — Other Ambulatory Visit: Payer: Self-pay | Admitting: Internal Medicine

## 2019-01-03 DIAGNOSIS — Z87440 Personal history of urinary (tract) infections: Secondary | ICD-10-CM

## 2019-10-10 ENCOUNTER — Other Ambulatory Visit: Payer: Self-pay | Admitting: Internal Medicine

## 2019-10-10 DIAGNOSIS — Z1231 Encounter for screening mammogram for malignant neoplasm of breast: Secondary | ICD-10-CM

## 2019-10-22 ENCOUNTER — Ambulatory Visit
Admission: RE | Admit: 2019-10-22 | Discharge: 2019-10-22 | Disposition: A | Discharge status: Home / Self Care | Payer: BC Managed Care – PPO | Source: Ambulatory Visit | Attending: Internal Medicine | Admitting: Internal Medicine

## 2019-10-22 ENCOUNTER — Other Ambulatory Visit: Payer: Self-pay

## 2019-10-22 DIAGNOSIS — Z1231 Encounter for screening mammogram for malignant neoplasm of breast: Secondary | ICD-10-CM | POA: Diagnosis not present

## 2020-11-18 ENCOUNTER — Ambulatory Visit: Payer: Self-pay | Admitting: Nurse Practitioner

## 2020-11-18 ENCOUNTER — Other Ambulatory Visit: Payer: Self-pay

## 2020-11-18 VITALS — BP 166/100 | HR 105 | Temp 99.6°F | Resp 16

## 2020-11-18 DIAGNOSIS — R0981 Nasal congestion: Secondary | ICD-10-CM

## 2020-11-18 LAB — POC COVID19 BINAXNOW: SARS Coronavirus 2 Ag: NEGATIVE

## 2020-11-18 NOTE — Progress Notes (Signed)
   Subjective:    Patient ID: Abigail Freeman, female    DOB: 28-Jul-1955, 65 y.o.   MRN: [id number]  HPI 65 year old female presenting with symptoms of: ST, PND and runny nose.   She has been suffering body aches as well   Has had COVID vaccine x3   She forgot her BP medication today- does have a history of HTN.    Review of Systems  Constitutional:  Positive for fatigue.  HENT:  Positive for congestion, rhinorrhea and sinus pressure.   Respiratory: Negative.    Cardiovascular: Negative.   Gastrointestinal: Negative.   Musculoskeletal:  Positive for myalgias.  Skin: Negative.   Neurological: Negative.       Objective:   Physical Exam Constitutional:      Appearance: Normal appearance.  HENT:     Head: Normocephalic.     Right Ear: Tympanic membrane, ear canal and external ear normal.     Left Ear: Tympanic membrane, ear canal and external ear normal.     Nose: Congestion present.     Mouth/Throat:     Mouth: Mucous membranes are moist.     Pharynx: No oropharyngeal exudate.  Eyes:     Pupils: Pupils are equal, round, and reactive to light.  Cardiovascular:     Rate and Rhythm: Normal rate and regular rhythm.  Pulmonary:     Effort: Pulmonary effort is normal.     Breath sounds: Normal breath sounds.  Musculoskeletal:     Cervical back: Normal range of motion.  Skin:    General: Skin is warm.  Neurological:     General: No focal deficit present.     Mental Status: She is alert.  Psychiatric:        Mood and Affect: Mood normal.    Recent Results (from the past 2160 hour(s))  POC COVID-19     Status: Normal   Collection Time: 11/18/20  3:02 PM  Result Value Ref Range   SARS Coronavirus 2 Ag Negative Negative        Assessment & Plan:   1. Nasal congestion  - POC COVID-19 (negative)   Advised return to clinic for BP check in 48 hours, restart oral BP medication as prescribed.   Use OTC cold/flu medication for symptom management.  Push fluids, rest stay  out of work until re evaluated Friday. RTC with any new or worsening symptoms as discussed

## 2020-11-20 ENCOUNTER — Encounter: Payer: Self-pay | Admitting: Nurse Practitioner

## 2020-11-20 ENCOUNTER — Ambulatory Visit: Payer: Self-pay | Admitting: Nurse Practitioner

## 2020-11-20 ENCOUNTER — Other Ambulatory Visit: Payer: Self-pay

## 2020-11-20 VITALS — BP 158/98 | HR 88 | Temp 98.2°F | Resp 18 | Ht 60.5 in | Wt 157.0 lb

## 2020-11-20 DIAGNOSIS — I10 Essential (primary) hypertension: Secondary | ICD-10-CM

## 2020-11-20 NOTE — Progress Notes (Signed)
   Subjective:    Patient ID: Abigail Freeman, female    DOB: Feb 01, 1955, 65 y.o.   MRN: [id number]  HPI  65 year old female returning to [facility] for follow up regarding elevated BP found at last appointment.   Patient was seen in clinic earlier this week with URI symptoms, tested negative for COVID at that time.  She has been managing drainage and cough with herbal tea. She feels improved today.   On the day of prior appointment she had skipped her Lisinopril. She has since been taking daily.   Today's Vitals   11/20/20 1337  BP: (!) 158/98  Pulse: 88  Resp: 18  Temp: 98.2 F (36.8 C)  TempSrc: Oral  SpO2: 97%  Weight: 157 lb (71.2 kg)  Height: 5' 0.5" (1.537 m)   Body mass index is 30.16 kg/m.   Review of Systems  Constitutional: Negative.   HENT:  Positive for congestion.   Respiratory:  Positive for cough.   Cardiovascular: Negative.   Genitourinary: Negative.   Musculoskeletal: Negative.   Neurological: Negative.       Objective:   Physical Exam HENT:     Head: Normocephalic.  Cardiovascular:     Rate and Rhythm: Normal rate and regular rhythm.     Heart sounds: Normal heart sounds.  Pulmonary:     Effort: Pulmonary effort is normal.     Breath sounds: Normal breath sounds.  Skin:    General: Skin is warm.  Neurological:     General: No focal deficit present.     Mental Status: She is alert.  Psychiatric:        Mood and Affect: Mood normal.          Assessment & Plan:  1. Hypertension, unspecified type Patient to return to clinic next week for BP check. If remain elevated after daily medication regimen and URI symptoms have resolved will refer back to PCP for evaluation.   She will RTC earlier with any new concerns.   OK to Return to work now.

## 2020-11-26 ENCOUNTER — Ambulatory Visit: Payer: Self-pay | Admitting: Nurse Practitioner

## 2020-11-26 ENCOUNTER — Other Ambulatory Visit: Payer: Self-pay

## 2020-11-26 VITALS — BP 158/100 | HR 87 | Temp 98.1°F | Resp 16

## 2020-11-26 DIAGNOSIS — I10 Essential (primary) hypertension: Secondary | ICD-10-CM

## 2020-11-26 NOTE — Progress Notes (Signed)
   Subjective:    Patient ID: Abigail Freeman, female    DOB: [birth year], 65 y.o.   MRN: [id number]  HPI  65 year old female returning to [facility] for a blood pressure recheck.  She was seen 11/18/20 for a sick visit while suffering a URI and her BP was noted to be elevated.  She has returned twice for BP rechecks since that time. See chart below. It has been verified that patient is taking her medication Lisinopril 10mg  daily.    Today's Vitals   11/26/20 1358  BP: (!) 158/100  Pulse: 87  Resp: 16  Temp: 98.1 F (36.7 C)  TempSrc: Oral  SpO2: 97%   There is no height or weight on file to calculate BMI.     11/18/2020 11/20/2020 Most Recent Value 11/29/2015 - 11/27/2020  BP 166/100 (A) 158/98 (A) 158/98 (A) 11/20/2020  Temp 99.6 F (37.6 C) 98.2 F (36.8 C) 98.2 F (36.8 C) 11/20/2020  Pulse 105 (A) 88 88 11/20/2020  Resp 16 18 18  11/20/2020  SpO2 98 % 97 % 97 % 11/20/2020  Weight  157 lb 157 lb 11/20/2020  Height  5' 0.5" (1.537 m) 5' 0.5" (1.537 m) 11/20/2020  BMI (Calculated)  30.15 30.15 11/20/2020        Objective:   Physical Exam Constitutional:      Appearance: Normal appearance.  HENT:     Head: Normocephalic.  Cardiovascular:     Rate and Rhythm: Normal rate and regular rhythm.  Pulmonary:     Effort: Pulmonary effort is normal.  Skin:    General: Skin is warm.  Neurological:     General: No focal deficit present.     Mental Status: She is alert.          Assessment & Plan:   Hypertension, unspecified type  Recommended follow up with PCP as soon as possible. Unable to send internal message to primary care office. Patient should be evaluated for medication optimization.   Encouraged to RTC with any new concerns.

## 2020-12-03 ENCOUNTER — Other Ambulatory Visit: Payer: Self-pay

## 2020-12-03 ENCOUNTER — Ambulatory Visit: Payer: Self-pay | Admitting: Nurse Practitioner

## 2020-12-03 ENCOUNTER — Encounter: Payer: Self-pay | Admitting: Nurse Practitioner

## 2020-12-03 VITALS — BP 174/108 | HR 83 | Temp 97.4°F | Resp 16 | Wt 159.0 lb

## 2020-12-03 DIAGNOSIS — I10 Essential (primary) hypertension: Secondary | ICD-10-CM

## 2020-12-03 NOTE — Progress Notes (Signed)
   Subjective:    Patient ID: Abigail Freeman, female    DOB: [birth year], 65 y.o.   MRN: [id number]  HPI  65 year old female returning to [facility] with complaints of ongoing high blood pressure. After her visit last week 11/26/20 she was seen by her PCP 11/27/20- she was switched from Lisinopril to Olmesartan 40mg  daily at that time. She has now been taking new medication for the past week.  Complaints of small headache daily   Today's Vitals   12/03/20 1412  BP: (!) 174/108  Pulse: 83  Resp: 16  Temp: (!) 97.4 F (36.3 C)  TempSrc: Tympanic  SpO2: 97%  Weight: 159 lb (72.1 kg)   Body mass index is 30.54 kg/m.   Review of Systems  Constitutional: Negative.   HENT: Negative.    Respiratory: Negative.    Cardiovascular: Negative.   Genitourinary: Negative.   Neurological: Negative.       Objective:   Physical Exam Constitutional:      Appearance: Normal appearance.  Pulmonary:     Effort: Pulmonary effort is normal.  Neurological:     Mental Status: She is alert.          Assessment & Plan:  Primary hypertension - Plan: Ambulatory referral to Cardiology  Discussed with patient need for Cardiology referral to address resistant hypertension To report to ED with any acutely worsening HA or Neurological symptoms

## 2020-12-28 ENCOUNTER — Ambulatory Visit: Payer: BC Managed Care – PPO | Admitting: Cardiology

## 2020-12-28 ENCOUNTER — Other Ambulatory Visit: Payer: Self-pay

## 2020-12-28 ENCOUNTER — Encounter: Payer: Self-pay | Admitting: Cardiology

## 2020-12-28 VITALS — BP 160/100 | HR 88 | Ht 60.0 in | Wt 157.2 lb

## 2020-12-28 DIAGNOSIS — I10 Essential (primary) hypertension: Secondary | ICD-10-CM | POA: Diagnosis not present

## 2020-12-28 MED ORDER — HYDROCHLOROTHIAZIDE 25 MG PO TABS: 25.0000 mg | ORAL_TABLET | Freq: Every day | ORAL | 1 refills | Status: DC

## 2020-12-28 NOTE — Patient Instructions (Signed)
Medication Instructions:   Your physician has recommended you make the following change in your medication:    START taking Hydrochlorothiazide 25 MG once a day.  *If you need a refill on your cardiac medications before your next appointment, please call your pharmacy*   Lab Work:  Your physician recommends that you return for lab work (BMP) in: 1 WEEK    Please return to our office on_____________________at______________am/pm    Testing/Procedures:  Your physician has requested that you have a renal artery duplex. During this test, an ultrasound is used to evaluate blood flow to the kidneys. Allow one hour for this exam. Do not eat after midnight the day before and avoid carbonated beverages. Take your medications as you usually do.    Follow-Up: At [facility], you and your health needs are our priority.  As part of our continuing mission to provide you with exceptional heart care, we have created designated Provider Care Teams.  These Care Teams include your primary Cardiologist (physician) and Advanced Practice Providers (APPs -  Physician Assistants and Nurse Practitioners) who all work together to provide you with the care you need, when you need it.  We recommend signing up for the patient portal called "MyChart".  Sign up information is provided on this After Visit Summary.  MyChart is used to connect with patients for Virtual Visits (Telemedicine).  Patients are able to view lab/test results, encounter notes, upcoming appointments, etc.  Non-urgent messages can be sent to your provider as well.   To learn more about what you can do with MyChart, go to [email or web address].    Your next appointment:   1 month(s)  The format for your next appointment:   In Person  Provider:   You may see Dr. Azucena Cecil or one of the following Advanced Practice Providers on your designated Care Team:   Nicolasa Ducking, NP Eula Listen, PA-C Cadence Fransico Michael, [place]    Other  Instructions

## 2020-12-28 NOTE — Progress Notes (Signed)
Cardiology Office Note:    Date:  12/28/2020   ID:  Magnus Ivan, DOB 1956/01/06, MRN [id number]  PCP:  Barbette Reichmann, MD   [facility] Providers Cardiologist:  None     Referring MD: Viviano Simas, FNP   Abigail Freeman is a 65 y.o. female who is being seen today for the evaluation of hypertension at the request of Viviano Simas, FNP.   Chief Complaint  Patient presents with   New Patient (Initial Visit)    Referred by work for Hypertension -- Patient c.o sometime not being able to breathe. Meds reviewed verbally with patient.     History of Present Illness:    Abigail Freeman is a 65 y.o. female with a hx of hypertension who presents with difficult to control blood pressures.  Patient has had hypertension for many years now.  Has been on BP medications all this time, states having elevated BPs over the past month.  Lisinopril was stopped, olmesartan 40 mg was started, amlodipine 5 mg also added.  Blood pressure still elevated.  Patient states he has been taking her BP medications yet today.  Otherwise denies chest pain, shortness of breath, edema.  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  Past Medical History:  Diagnosis Date   Hypertension     Past Surgical History:  Procedure Laterality Date   CHOLECYSTECTOMY      Current Medications: Current Meds  Medication Sig   amLODipine (NORVASC) 5 MG tablet Take 5 mg by mouth daily.   hydrochlorothiazide (HYDRODIURIL) 25 MG tablet Take 1 tablet (25 mg total) by mouth daily.   ibuprofen (ADVIL) 200 MG tablet Take 400 mg by mouth every 6 (six) hours as needed.   olmesartan (BENICAR) 40 MG tablet Take 40 mg by mouth daily.     Allergies:   Patient has no known allergies.   Social History   Socioeconomic History   Marital status: Married    Spouse name: Not on file   Number of children: Not on file   Years of education: Not on file   Highest education level: Not on file  Occupational  History   Not on file  Tobacco Use   Smoking status: Never   Smokeless tobacco: Never  Substance and Sexual Activity   Alcohol use: No   Drug use: Not on file   Sexual activity: Not on file  Other Topics Concern   Not on file  Social History Narrative   Not on file   Social Determinants of Health   Financial Resource Strain: Not on file  Food Insecurity: Not on file  Transportation Needs: Not on file  Physical Activity: Not on file  Stress: Not on file  Social Connections: Not on file     Family History: The patient's family history includes Hypertension in her mother; Pancreatic cancer in her brother; Stomach cancer in her father. There is no history of Breast cancer.  ROS:   Please see the history of present illness.     All other systems reviewed and are negative.  EKGs/Labs/Other Studies Reviewed:    The following studies were reviewed today:   EKG:  EKG is  ordered today.  The ekg ordered today demonstrates normal sinus rhythm, normal ECG.  Recent Labs: No results found for requested labs within last 8760 hours.  Recent Lipid Panel No results found for: CHOL, TRIG, HDL, CHOLHDL, VLDL, LDLCALC, LDLDIRECT   Risk Assessment/Calculations:  Physical Exam:    VS:  BP (!) 160/100 (BP Location: Left Arm, Patient Position: Sitting, Cuff Size: Normal)   Pulse 88   Ht 5' (1.524 m)   Wt 157 lb 4 oz (71.3 kg)   BMI 30.71 kg/m     Wt Readings from Last 3 Encounters:  12/28/20 157 lb 4 oz (71.3 kg)  12/03/20 159 lb (72.1 kg)  11/20/20 157 lb (71.2 kg)     GEN:  Well nourished, well developed in no acute distress HEENT: Normal NECK: No JVD; No carotid bruits LYMPHATICS: No lymphadenopathy CARDIAC: RRR, no murmurs, rubs, gallops RESPIRATORY:  Clear to auscultation without rales, wheezing or rhonchi  ABDOMEN: Soft, non-tender, non-distended MUSCULOSKELETAL:  No edema; No deformity  SKIN: Warm and dry NEUROLOGIC:  Alert and oriented x 3 PSYCHIATRIC:   Normal affect   ASSESSMENT:    1. Primary hypertension    PLAN:    In order of problems listed above:  Hypertension, BP elevated.  Continue olmesartan 40, amlodipine 5 mg daily.  Start HCTZ 25 mg daily, check BMP in 1 week.  Obtain renal arterial ultrasound.  If BP still elevated at follow-up visit, plan to titrate amlodipine.  Low-salt diet advised.  Total encounter time 60 minutes  Greater than 50% was spent in counseling and coordination of care with the patient Spanish interpreter used for this visit.       Medication Adjustments/Labs and Tests Ordered: Current medicines are reviewed at length with the patient today.  Concerns regarding medicines are outlined above.  Orders Placed This Encounter  Procedures   Basic metabolic panel   EKG 12-Lead   VAS US RENAL ARTERY DUPLEX    Meds ordered this encounter  Medications   hydrochlorothiazide (HYDRODIURIL) 25 MG tablet    Sig: Take 1 tablet (25 mg total) by mouth daily.    Dispense:  90 tablet    Refill:  1     Patient Instructions  Medication Instructions:   Your physician has recommended you make the following change in your medication:    START taking Hydrochlorothiazide 25 MG once a day.  *If you need a refill on your cardiac medications before your next appointment, please call your pharmacy*   Lab Work:  Your physician recommends that you return for lab work (BMP) in: 1 WEEK    Please return to our office on_____________________at______________am/pm    Testing/Procedures:  Your physician has requested that you have a renal artery duplex. During this test, an ultrasound is used to evaluate blood flow to the kidneys. Allow one hour for this exam. Do not eat after midnight the day before and avoid carbonated beverages. Take your medications as you usually do.    Follow-Up: At [facility], you and your health needs are our priority.  As part of our continuing mission to provide you with exceptional  heart care, we have created designated Provider Care Teams.  These Care Teams include your primary Cardiologist (physician) and Advanced Practice Providers (APPs -  Physician Assistants and Nurse Practitioners) who all work together to provide you with the care you need, when you need it.  We recommend signing up for the patient portal called "MyChart".  Sign up information is provided on this After Visit Summary.  MyChart is used to connect with patients for Virtual Visits (Telemedicine).  Patients are able to view lab/test results, encounter notes, upcoming appointments, etc.  Non-urgent messages can be sent to your provider as well.   To learn more  about what you can do with MyChart, go to [email or web address].    Your next appointment:   1 month(s)  The format for your next appointment:   In Person  Provider:   You may see Dr. Azucena Cecil or one of the following Advanced Practice Providers on your designated Care Team:   Nicolasa Ducking, NP Eula Listen, PA-C Cadence Fransico Michael, [place]    Other Instructions    Signed, Debbe Odea, MD  12/28/2020 12:30 PM    [facility]

## 2021-01-04 ENCOUNTER — Other Ambulatory Visit: Payer: Self-pay | Admitting: Internal Medicine

## 2021-01-04 ENCOUNTER — Other Ambulatory Visit (INDEPENDENT_AMBULATORY_CARE_PROVIDER_SITE_OTHER): Payer: BC Managed Care – PPO

## 2021-01-04 ENCOUNTER — Other Ambulatory Visit: Payer: Self-pay

## 2021-01-04 DIAGNOSIS — Z1231 Encounter for screening mammogram for malignant neoplasm of breast: Secondary | ICD-10-CM

## 2021-01-04 DIAGNOSIS — I10 Essential (primary) hypertension: Secondary | ICD-10-CM

## 2021-01-05 LAB — BASIC METABOLIC PANEL
BUN/Creatinine Ratio: 30 — ABNORMAL HIGH (ref 12–28)
BUN: 21 mg/dL (ref 8–27)
CO2: 24 mmol/L (ref 20–29)
Calcium: 9.1 mg/dL (ref 8.7–10.3)
Chloride: 104 mmol/L (ref 96–106)
Creatinine, Ser: 0.71 mg/dL (ref 0.57–1.00)
Glucose: 98 mg/dL (ref 70–99)
Potassium: 4.3 mmol/L (ref 3.5–5.2)
Sodium: 141 mmol/L (ref 134–144)
eGFR: 94 mL/min/{1.73_m2} (ref >59)

## 2021-01-12 ENCOUNTER — Telehealth: Payer: Self-pay

## 2021-01-12 NOTE — Telephone Encounter (Signed)
-----   Message from Debbe Odea, MD sent at 01/07/2021  5:31 PM EST ----- Creatinine, potassium normal.  Continue medications as prescribed.

## 2021-01-12 NOTE — Telephone Encounter (Signed)
Called patient using interpreter [phone]. Patient has a VM that has not been set up. Will mail the normal lab result to her home address.

## 2021-01-13 ENCOUNTER — Other Ambulatory Visit: Payer: Self-pay

## 2021-01-13 ENCOUNTER — Ambulatory Visit
Admission: RE | Admit: 2021-01-13 | Discharge: 2021-01-13 | Disposition: A | Discharge status: Home / Self Care | Payer: BC Managed Care – PPO | Source: Ambulatory Visit | Attending: Internal Medicine | Admitting: Internal Medicine

## 2021-01-13 DIAGNOSIS — Z1231 Encounter for screening mammogram for malignant neoplasm of breast: Secondary | ICD-10-CM | POA: Insufficient documentation

## 2021-01-15 ENCOUNTER — Other Ambulatory Visit: Payer: Self-pay

## 2021-01-15 ENCOUNTER — Ambulatory Visit (INDEPENDENT_AMBULATORY_CARE_PROVIDER_SITE_OTHER): Payer: BC Managed Care – PPO

## 2021-01-15 DIAGNOSIS — I10 Essential (primary) hypertension: Secondary | ICD-10-CM

## 2021-01-26 ENCOUNTER — Other Ambulatory Visit: Payer: Self-pay

## 2021-01-26 ENCOUNTER — Ambulatory Visit: Payer: BC Managed Care – PPO | Admitting: Cardiology

## 2021-01-26 ENCOUNTER — Encounter: Payer: Self-pay | Admitting: Cardiology

## 2021-01-26 VITALS — BP 130/78 | HR 68 | Ht 60.0 in | Wt 159.0 lb

## 2021-01-26 DIAGNOSIS — R0602 Shortness of breath: Secondary | ICD-10-CM

## 2021-01-26 DIAGNOSIS — I10 Essential (primary) hypertension: Secondary | ICD-10-CM | POA: Diagnosis not present

## 2021-01-26 NOTE — Patient Instructions (Signed)
Medication Instructions:  Your physician recommends that you continue on your current medications as directed. Please refer to the Current Medication list given to you today.   *If you need a refill on your cardiac medications before your next appointment, please call your pharmacy*   Lab Work: None ordered  If you have labs (blood work) drawn today and your tests are completely normal, you will receive your results only by: MyChart Message (if you have MyChart) OR A paper copy in the mail If you have any lab test that is abnormal or we need to change your treatment, we will call you to review the results.   Testing/Procedures: Echocardiogram - Your physician has requested that you have an echocardiogram prior to your 4 month follow up. Echocardiography is a painless test that uses sound waves to create images of your heart. It provides your doctor with information about the size and shape of your heart and how well your hearts chambers and valves are working. This procedure takes approximately one hour. There are no restrictions for this procedure.    Follow-Up: At [facility], you and your health needs are our priority.  As part of our continuing mission to provide you with exceptional heart care, we have created designated Provider Care Teams.  These Care Teams include your primary Cardiologist (physician) and Advanced Practice Providers (APPs -  Physician Assistants and Nurse Practitioners) who all work together to provide you with the care you need, when you need it.  We recommend signing up for the patient portal called "MyChart".  Sign up information is provided on this After Visit Summary.  MyChart is used to connect with patients for Virtual Visits (Telemedicine).  Patients are able to view lab/test results, encounter notes, upcoming appointments, etc.  Non-urgent messages can be sent to your provider as well.   To learn more about what you can do with MyChart, go to  [email or web address].    Your next appointment:   4 month(s)  The format for your next appointment:   In Person  Provider:   You may see Debbe Odea, MD or one of the following Advanced Practice Providers on your designated Care Team:   Nicolasa Ducking, NP Eula Listen, PA-C Cadence Fransico Michael, PA-C  :1}    Other Instructions N/A

## 2021-01-26 NOTE — Progress Notes (Signed)
Cardiology Office Note:    Date:  01/26/2021   ID:  Abigail Freeman, DOB July 22, 1955, MRN [id number]  PCP:  Barbette Reichmann, MD   [facility] Providers Cardiologist:  None     Referring MD: Barbette Reichmann, MD   Abigail Freeman is a 65 y.o. female who is being seen today for the evaluation of hypertension at the request of Barbette Reichmann, MD.   Chief Complaint  Patient presents with   Other    1 month f/u no complaints today. Meds reviewed verbally with pt.    History of Present Illness:    Beckham Buxbaum is a 65 y.o. female with a hx of hypertension who presents for follow-up.  Previously seen due to difficulty with controlling blood pressures.    Renal arterial ultrasound was ordered to evaluate any stenosis.  HCTZ 25 mg daily was started after last visit.  Compliant with medications including olmesartan and amlodipine.  She is tolerating all medications as prescribed.  She complains of shortness of breath most of the time, not always associated with exertion.  Otherwise has no other concerns.  Presents for testing results.   Past Medical History:  Diagnosis Date   Hypertension     Past Surgical History:  Procedure Laterality Date   CHOLECYSTECTOMY      Current Medications: Current Meds  Medication Sig   amLODipine (NORVASC) 5 MG tablet Take 5 mg by mouth daily.   cyanocobalamin 1000 MCG tablet Take by mouth daily.   hydrochlorothiazide (HYDRODIURIL) 25 MG tablet Take 1 tablet (25 mg total) by mouth daily.   ibuprofen (ADVIL) 200 MG tablet Take 400 mg by mouth every 6 (six) hours as needed.   olmesartan (BENICAR) 40 MG tablet Take 40 mg by mouth daily.   VITAMIN E PO Take by mouth daily in the afternoon.     Allergies:   Patient has no known allergies.   Social History   Socioeconomic History   Marital status: Married    Spouse name: Not on file   Number of children: Not on file   Years of education: Not on file   Highest education level: Not on file   Occupational History   Not on file  Tobacco Use   Smoking status: Never   Smokeless tobacco: Never  Substance and Sexual Activity   Alcohol use: No   Drug use: Not on file   Sexual activity: Not on file  Other Topics Concern   Not on file  Social History Narrative   Not on file   Social Determinants of Health   Financial Resource Strain: Not on file  Food Insecurity: Not on file  Transportation Needs: Not on file  Physical Activity: Not on file  Stress: Not on file  Social Connections: Not on file     Family History: The patient's family history includes Hypertension in her mother; Pancreatic cancer in her brother; Stomach cancer in her father. There is no history of Breast cancer.  ROS:   Please see the history of present illness.     All other systems reviewed and are negative.  EKGs/Labs/Other Studies Reviewed:    The following studies were reviewed today:   EKG:  EKG is  ordered today.  The ekg ordered today demonstrates normal sinus rhythm, normal ECG.  Recent Labs: 01/04/2021: BUN 21; Creatinine, Ser 0.71; Potassium 4.3; Sodium 141  Recent Lipid Panel No results found for: CHOL, TRIG, HDL, CHOLHDL, VLDL, LDLCALC, LDLDIRECT   Risk Assessment/Calculations:  Physical Exam:    VS:  BP 130/78 (BP Location: Left Arm, Patient Position: Sitting, Cuff Size: Normal)    Pulse 68    Ht 5' (1.524 m)    Wt 159 lb (72.1 kg)    SpO2 98%    BMI 31.05 kg/m     Wt Readings from Last 3 Encounters:  01/26/21 159 lb (72.1 kg)  12/28/20 157 lb 4 oz (71.3 kg)  12/03/20 159 lb (72.1 kg)     GEN:  Well nourished, well developed in no acute distress HEENT: Normal NECK: No JVD; No carotid bruits LYMPHATICS: No lymphadenopathy CARDIAC: RRR, no murmurs, rubs, gallops RESPIRATORY:  Clear to auscultation without rales, wheezing or rhonchi  ABDOMEN: Soft, non-tender, non-distended MUSCULOSKELETAL:  No edema; No deformity  SKIN: Warm and dry NEUROLOGIC:  Alert and  oriented x 3 PSYCHIATRIC:  Normal affect   ASSESSMENT:    1. Primary hypertension   2. Shortness of breath     PLAN:    In order of problems listed above:  Hypertension, BP now controlled.  No evidence of stenosis on renal arterial ultrasound.  Continue olmesartan 40, amlodipine 5 mg daily, HCTZ 25 mg daily. Shortness of breath, not consistent with angina.  Denies chest pain.  Get echo to evaluate any structural abnormalities.  Graduated exercising advised.  Total encounter time 40 minutes  Greater than 50% was spent in counseling and coordination of care with the patient Spanish interpreter used for this visit.      Medication Adjustments/Labs and Tests Ordered: Current medicines are reviewed at length with the patient today.  Concerns regarding medicines are outlined above.  Orders Placed This Encounter  Procedures   ECHOCARDIOGRAM COMPLETE    No orders of the defined types were placed in this encounter.    Patient Instructions  Medication Instructions:  Your physician recommends that you continue on your current medications as directed. Please refer to the Current Medication list given to you today.   *If you need a refill on your cardiac medications before your next appointment, please call your pharmacy*   Lab Work: None ordered  If you have labs (blood work) drawn today and your tests are completely normal, you will receive your results only by: MyChart Message (if you have MyChart) OR A paper copy in the mail If you have any lab test that is abnormal or we need to change your treatment, we will call you to review the results.   Testing/Procedures: Echocardiogram - Your physician has requested that you have an echocardiogram prior to your 4 month follow up. Echocardiography is a painless test that uses sound waves to create images of your heart. It provides your doctor with information about the size and shape of your heart and how well your hearts chambers  and valves are working. This procedure takes approximately one hour. There are no restrictions for this procedure.    Follow-Up: At [facility], you and your health needs are our priority.  As part of our continuing mission to provide you with exceptional heart care, we have created designated Provider Care Teams.  These Care Teams include your primary Cardiologist (physician) and Advanced Practice Providers (APPs -  Physician Assistants and Nurse Practitioners) who all work together to provide you with the care you need, when you need it.  We recommend signing up for the patient portal called "MyChart".  Sign up information is provided on this After Visit Summary.  MyChart is used to connect with patients for  Virtual Visits (Telemedicine).  Patients are able to view lab/test results, encounter notes, upcoming appointments, etc.  Non-urgent messages can be sent to your provider as well.   To learn more about what you can do with MyChart, go to [email or web address].    Your next appointment:   4 month(s)  The format for your next appointment:   In Person  Provider:   You may see Debbe Odea, MD or one of the following Advanced Practice Providers on your designated Care Team:   Nicolasa Ducking, NP Eula Listen, PA-C Cadence Fransico Michael, PA-C  :1}    Other Instructions N/A    Signed, Debbe Odea, MD  01/26/2021 12:43 PM    [facility]

## 2021-04-29 ENCOUNTER — Ambulatory Visit: Payer: BC Managed Care – PPO | Admitting: Family

## 2021-04-29 ENCOUNTER — Encounter: Payer: Self-pay | Admitting: Family

## 2021-04-29 VITALS — BP 168/94 | HR 115 | Temp 97.3°F | Resp 16

## 2021-04-29 DIAGNOSIS — S60519A Abrasion of unspecified hand, initial encounter: Secondary | ICD-10-CM

## 2021-04-29 NOTE — Progress Notes (Signed)
[employer]  ?[facility] ?[address]  ?[place], [place] [address] ?Phone [phone] ?Fax  [phone] ? ?Worker's Compensation Report Form  ? 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 ?Date of Injury:04/29/2021 ?Time of Injury: 1:45pm ?Shift Worked: 2 ?Location where injury occurred (address or landmark): Sidewalk in front of [employer] ? ?Body Part Injured: Larey Seat forward and skinned both hands with minor abrasions ? ?Vital Signs ?VS = 97.3 tympanic, HR 115, BP 168/94, 02 sat 97% Weight = 160lbs ? ?Injury Description  ?Patient was going in to a meeting at [employer] at 1:45pm (meeting at 2 pm). She tripped on sidewalk and fell forward and skinned her hands with minor abrasions. She was able to attend the hour long meeting. Then her supervisor sent her to [facility] for evaluation.  ? ?  ?Provider Note ?Pt presents in no acute distress.  Musculoskeletal exam normal, with normal ROM of bilateral hands and wrists.  No pain in any other joint.  Skin exam abnormal on bilateral hands. Two superficial abrasion to right post wrist and bilateral palms.  No active bleeding. Pt declines pain at this time.  No otc medications for comfort.  Return to clinic with any worsening symptoms, will order X-Ray. ? ? ?Diagnosis  ?Abrasion, hand without infection ? ? ?Medications Prescribed  ?Otc Tylenol ? ? ?Referred to  ?N/A ? ? ? ?Return to Work Status ?Pt may return to work.  Keep wounds covered to prevent infection.   ? ? ?Provider Signature ________________________________________Date_________ ? ? ?Employee Signature _______________________________________Date_________ ? ? ?Please email this completed form to Angus Seller, Director of Risk Management at [email or web address] within 24 hours of visit.   ?

## 2021-05-27 ENCOUNTER — Ambulatory Visit (INDEPENDENT_AMBULATORY_CARE_PROVIDER_SITE_OTHER): Payer: BC Managed Care – PPO

## 2021-05-27 DIAGNOSIS — R0602 Shortness of breath: Secondary | ICD-10-CM | POA: Diagnosis not present

## 2021-05-27 LAB — ECHOCARDIOGRAM COMPLETE
AR max vel: 2.46 cm2
AV Area VTI: 2.88 cm2
AV Area mean vel: 2.8 cm2
AV Mean grad: 3 mmHg
AV Peak grad: 6 mmHg
Ao pk vel: 1.22 m/s
Area-P 1/2: 4.46 cm2
Calc EF: 56.5 %
S' Lateral: 2.6 cm
Single Plane A2C EF: 56 %
Single Plane A4C EF: 54.8 %

## 2021-06-01 ENCOUNTER — Ambulatory Visit: Payer: BC Managed Care – PPO | Admitting: Cardiology

## 2021-06-01 ENCOUNTER — Encounter: Payer: Self-pay | Admitting: Cardiology

## 2021-06-01 VITALS — BP 130/90 | HR 72 | Ht 60.0 in | Wt 160.0 lb

## 2021-06-01 DIAGNOSIS — R0602 Shortness of breath: Secondary | ICD-10-CM | POA: Diagnosis not present

## 2021-06-01 DIAGNOSIS — I10 Essential (primary) hypertension: Secondary | ICD-10-CM | POA: Diagnosis not present

## 2021-06-01 NOTE — Patient Instructions (Signed)
Medication Instructions:  ?Your physician recommends that you continue on your current medications as directed. Please refer to the Current Medication list given to you today. ? ?*If you need a refill on your cardiac medications before your next appointment, please call your pharmacy* ? ? ?Lab Work: ?None ordered ?If you have labs (blood work) drawn today and your tests are completely normal, you will receive your results only by: ?MyChart Message (if you have MyChart) OR ?A paper copy in the mail ?If you have any lab test that is abnormal or we need to change your treatment, we will call you to review the results. ? ? ?Testing/Procedures: ?None ordered ? ? ?Follow-Up: ?At [facility], you and your health needs are our priority.  As part of our continuing mission to provide you with exceptional heart care, we have created designated Provider Care Teams.  These Care Teams include your primary Cardiologist (physician) and Advanced Practice Providers (APPs -  Physician Assistants and Nurse Practitioners) who all work together to provide you with the care you need, when you need it. ? ?We recommend signing up for the patient portal called "MyChart".  Sign up information is provided on this After Visit Summary.  MyChart is used to connect with patients for Virtual Visits (Telemedicine).  Patients are able to view lab/test results, encounter notes, upcoming appointments, etc.  Non-urgent messages can be sent to your provider as well.   ?To learn more about what you can do with MyChart, go to [email or web address].   ? ?Your next appointment:   ?6 month(s) ? ?The format for your next appointment:   ?In Person ? ?Provider:   ?You will see one of the following Advanced Practice Providers on your designated Care Team:   ?Christopher Berge, NP ?Ryan Dunn, PA-C ?Cadence Furth, PA-C ? ? ? ? ? ?Important Information About Sugar ? ? ? ? ? ? ?

## 2021-06-01 NOTE — Progress Notes (Signed)
?Cardiology Office Note:   ? ?Date:  06/01/2021  ? ?ID:  Abigail Freeman, DOB 1956-01-30, MRN [id number] ? ?PCP:  Barbette ReichmannHande, Vishwanath, MD ?  ?[facility] Providers ?Cardiologist:  None    ? ?Referring MD: Barbette ReichmannHande, Vishwanath, MD  ? ? ?Chief Complaint  ?Patient presents with  ? Other  ?  4 Month f/u pt would like to discuss BP readings. Meds reviewed verbally with pt.  ? ? ?History of Present Illness:   ? ?Abigail Freeman is a 66 y.o. female with a hx of hypertension who presents for follow-up.  Previously seen due to difficulty with controlling blood pressures and shortness of breath.   ? ?HCTZ added to her medical regimen, echocardiogram was ordered to evaluate cardiac dysfunction.  She feels well otherwise, blood pressures at home have been ranging from 115-150s, average 130s.  Tolerating all medications without any adverse effects.  Shortness of breath better. ? ?Prior notes/studies ?Echo 05/2021 EF 60 to 65%, impaired relaxation. ?Renal arterial ultrasound 12/2020 no renal artery stenosis ? ? ?Past Medical History:  ?Diagnosis Date  ? Hypertension   ? ? ?Past Surgical History:  ?Procedure Laterality Date  ? CHOLECYSTECTOMY    ? ? ?Current Medications: ?Current Meds  ?Medication Sig  ? amLODipine (NORVASC) 5 MG tablet Take 5 mg by mouth daily.  ? cyanocobalamin 1000 MCG tablet Take by mouth daily.  ? hydrochlorothiazide (HYDRODIURIL) 25 MG tablet Take 1 tablet (25 mg total) by mouth daily.  ? ibuprofen (ADVIL) 200 MG tablet Take 400 mg by mouth every 6 (six) hours as needed.  ? olmesartan (BENICAR) 40 MG tablet Take 40 mg by mouth daily.  ? VITAMIN E PO Take by mouth daily in the afternoon.  ?  ? ?Allergies:   Patient has no known allergies.  ? ?Social History  ? ?Socioeconomic History  ? Marital status: Married  ?  Spouse name: Not on file  ? Number of children: Not on file  ? Years of education: Not on file  ? Highest education level: Not on file  ?Occupational History  ? Not on file  ?Tobacco Use  ? Smoking status: Never   ? Smokeless tobacco: Never  ?Substance and Sexual Activity  ? Alcohol use: No  ? Drug use: Not on file  ? Sexual activity: Not on file  ?Other Topics Concern  ? Not on file  ?Social History Narrative  ? Not on file  ? ?Social Determinants of Health  ? ?Financial Resource Strain: Not on file  ?Food Insecurity: Not on file  ?Transportation Needs: Not on file  ?Physical Activity: Not on file  ?Stress: Not on file  ?Social Connections: Not on file  ?  ? ?Family History: ?The patient's family history includes Hypertension in her mother; Pancreatic cancer in her brother; Stomach cancer in her father. There is no history of Breast cancer. ? ?ROS:   ?Please see the history of present illness.    ? All other systems reviewed and are negative. ? ?EKGs/Labs/Other Studies Reviewed:   ? ?The following studies were reviewed today: ? ? ?EKG:  EKG is  ordered today.  The ekg ordered today demonstrates normal sinus rhythm, normal ECG. ? ?Recent Labs: ?01/04/2021: BUN 21; Creatinine, Ser 0.71; Potassium 4.3; Sodium 141  ?Recent Lipid Panel ?No results found for: CHOL, TRIG, HDL, CHOLHDL, VLDL, LDLCALC, LDLDIRECT ? ? ?Risk Assessment/Calculations:   ? ? ?    ? ?Physical Exam:   ? ?VS:  BP 130/90 (BP Location: Left Arm,  Patient Position: Sitting, Cuff Size: Normal)   Pulse 72   Ht 5' (1.524 m)   Wt 160 lb (72.6 kg)   SpO2 98%   BMI 31.25 kg/m?    ? ?Wt Readings from Last 3 Encounters:  ?06/01/21 160 lb (72.6 kg)  ?01/26/21 159 lb (72.1 kg)  ?12/28/20 157 lb 4 oz (71.3 kg)  ?  ? ?GEN:  Well nourished, well developed in no acute distress ?HEENT: Normal ?NECK: No JVD; No carotid bruits ?LYMPHATICS: No lymphadenopathy ?CARDIAC: RRR, no murmurs, rubs, gallops ?RESPIRATORY:  Clear to auscultation without rales, wheezing or rhonchi  ?ABDOMEN: Soft, non-tender, non-distended ?MUSCULOSKELETAL:  No edema; No deformity  ?SKIN: Warm and dry ?NEUROLOGIC:  Alert and oriented x 3 ?PSYCHIATRIC:  Normal affect  ? ?ASSESSMENT:   ? ?1. Primary  hypertension   ?2. Shortness of breath   ? ?PLAN:   ? ?In order of problems listed above: ? ?Hypertension, BP now controlled.    Continue olmesartan 40, amlodipine 5 mg daily, HCTZ 25 mg daily. ?Shortness of breath, not consistent with angina.  Echocardiogram 05/2021 showed normal EF 60 to 65%, impaired relaxation, no findings to suggest etiology of shortness of breath.  Symptoms overall improved. ? ?Follow-up in 6 months ? ?Total encounter time 35 minutes ? Greater than 50% was spent in counseling and coordination of care with the patient ?Spanish interpreter used for this visit. ? ?   ? ?Medication Adjustments/Labs and Tests Ordered: ?Current medicines are reviewed at length with the patient today.  Concerns regarding medicines are outlined above.  ?No orders of the defined types were placed in this encounter. ? ? ?No orders of the defined types were placed in this encounter. ? ? ? ?Patient Instructions  ?Medication Instructions:  ?Your physician recommends that you continue on your current medications as directed. Please refer to the Current Medication list given to you today. ? ?*If you need a refill on your cardiac medications before your next appointment, please call your pharmacy* ? ? ?Lab Work: ?None ordered ?If you have labs (blood work) drawn today and your tests are completely normal, you will receive your results only by: ?MyChart Message (if you have MyChart) OR ?A paper copy in the mail ?If you have any lab test that is abnormal or we need to change your treatment, we will call you to review the results. ? ? ?Testing/Procedures: ?None ordered ? ? ?Follow-Up: ?At [facility], you and your health needs are our priority.  As part of our continuing mission to provide you with exceptional heart care, we have created designated Provider Care Teams.  These Care Teams include your primary Cardiologist (physician) and Advanced Practice Providers (APPs -  Physician Assistants and Nurse Practitioners) who all  work together to provide you with the care you need, when you need it. ? ?We recommend signing up for the patient portal called "MyChart".  Sign up information is provided on this After Visit Summary.  MyChart is used to connect with patients for Virtual Visits (Telemedicine).  Patients are able to view lab/test results, encounter notes, upcoming appointments, etc.  Non-urgent messages can be sent to your provider as well.   ?To learn more about what you can do with MyChart, go to [email or web address].   ? ?Your next appointment:   ?6 month(s) ? ?The format for your next appointment:   ?In Person ? ?Provider:   ?You will see one of the following Advanced Practice Providers on your designated Care Team:   ?  Nicolasa Ducking, NP ?Eula Listen, PA-C ?Cadence Fransico Michael, PA-C ? ?  ? ? ? ?Important Information About Sugar ? ? ? ? ? ?  ? ?Signed, ?Debbe Odea, MD  ?06/01/2021 10:22 AM    ?[facility] ? ?

## 2021-06-02 NOTE — Addendum Note (Signed)
Addended by: Kendrick Fries on: 06/02/2021 09:59 AM ? ? Modules accepted: Orders ? ?

## 2021-07-01 ENCOUNTER — Other Ambulatory Visit: Payer: Self-pay

## 2021-07-01 MED ORDER — HYDROCHLOROTHIAZIDE 25 MG PO TABS: 25.0000 mg | ORAL_TABLET | Freq: Every day | ORAL | 3 refills | Status: DC

## 2021-07-05 ENCOUNTER — Other Ambulatory Visit: Payer: Self-pay

## 2021-07-05 MED ORDER — HYDROCHLOROTHIAZIDE 25 MG PO TABS: 25.0000 mg | ORAL_TABLET | Freq: Every day | ORAL | 0 refills | Status: AC

## 2021-11-26 ENCOUNTER — Other Ambulatory Visit: Payer: Self-pay | Admitting: Internal Medicine

## 2021-11-26 DIAGNOSIS — Z1231 Encounter for screening mammogram for malignant neoplasm of breast: Secondary | ICD-10-CM

## 2021-12-03 ENCOUNTER — Ambulatory Visit: Payer: BC Managed Care – PPO | Attending: Medical | Admitting: Medical

## 2021-12-03 NOTE — Progress Notes (Deleted)
Cardiology Office Note:    Date:  12/03/2021   ID:  Abigail Freeman, DOB 02/21/1955, MRN [id number]  PCP:  Tracie Harrier, MD  [facility] Cardiologist:  None  [facility] Electrophysiologist:  None   Referring MD: Tracie Harrier, MD   Chief Complaint: ***  History of Present Illness:    Abigail Freeman is a 66 y.o. female with a hx of hypertension presents for 70-month follow-up.  Renal artery ultrasound on 01/19/2021 showed no renal artery stenosis. Echo from 05/20/2021 showed LVEF 60 to 65%, impaired relaxation.  Patient was last seen 5-23 for blood pressure follow-up.  Blood pressure was better with addition of hydrochlorothiazide.  Today,   Past Medical History:  Diagnosis Date   Hypertension     Past Surgical History:  Procedure Laterality Date   CHOLECYSTECTOMY      Current Medications: No outpatient medications have been marked as taking for the 12/03/21 encounter (Appointment) with Kathlen Mody, Jiovanny Burdell H, PA-C.     Allergies:   Patient has no known allergies.   Social History   Socioeconomic History   Marital status: Married    Spouse name: Not on file   Number of children: Not on file   Years of education: Not on file   Highest education level: Not on file  Occupational History   Not on file  Tobacco Use   Smoking status: Never   Smokeless tobacco: Never  Substance and Sexual Activity   Alcohol use: No   Drug use: Not on file   Sexual activity: Not on file  Other Topics Concern   Not on file  Social History Narrative   Not on file   Social Determinants of Health   Financial Resource Strain: Not on file  Food Insecurity: Not on file  Transportation Needs: Not on file  Physical Activity: Not on file  Stress: Not on file  Social Connections: Not on file     Family History: The patient's family history includes Hypertension in her mother; Pancreatic cancer in her brother; Stomach cancer in her father. There is no history of Breast  cancer.  ROS:   Please see the history of present illness.     All other systems reviewed and are negative.  EKGs/Labs/Other Studies Reviewed:    The following studies were reviewed today:  Echo 05/2021  1. Left ventricular ejection fraction, by estimation, is 60 to 65%. The  left ventricle has normal function. The left ventricle has no regional  wall motion abnormalities. Left ventricular diastolic parameters are  consistent with Grade I diastolic  dysfunction (impaired relaxation).   2. Right ventricular systolic function is normal. The right ventricular  size is normal. There is normal pulmonary artery systolic pressure. The  estimated right ventricular systolic pressure is 38.7 mmHg.   3. The mitral valve is normal in structure. No evidence of mitral valve  regurgitation. No evidence of mitral stenosis.   4. The aortic valve is normal in structure. Aortic valve regurgitation is  not visualized. Aortic valve sclerosis is present, with no evidence of  aortic valve stenosis.   5. The inferior vena cava is normal in size with <50% respiratory  variability, suggesting right atrial pressure of 8 mmHg.    EKG:  EKG is *** ordered today.  The ekg ordered today demonstrates ***  Recent Labs: 01/04/2021: BUN 21; Creatinine, Ser 0.71; Potassium 4.3; Sodium 141  Recent Lipid Panel No results found for: "CHOL", "TRIG", "HDL", "CHOLHDL", "VLDL", "LDLCALC", "LDLDIRECT"   Risk Assessment/Calculations:   {  Does this patient have ATRIAL FIBRILLATION?:[phone]}   Physical Exam:    VS:  There were no vitals taken for this visit.    Wt Readings from Last 3 Encounters:  06/01/21 160 lb (72.6 kg)  01/26/21 159 lb (72.1 kg)  12/28/20 157 lb 4 oz (71.3 kg)     GEN: *** Well nourished, well developed in no acute distress HEENT: Normal NECK: No JVD; No carotid bruits LYMPHATICS: No lymphadenopathy CARDIAC: ***RRR, no murmurs, rubs, gallops RESPIRATORY:  Clear to auscultation without  rales, wheezing or rhonchi  ABDOMEN: Soft, non-tender, non-distended MUSCULOSKELETAL:  No edema; No deformity  SKIN: Warm and dry NEUROLOGIC:  Alert and oriented x 3 PSYCHIATRIC:  Normal affect   ASSESSMENT:    No diagnosis found. PLAN:    In order of problems listed above:  ***  Disposition: Follow up {follow up:15908} with ***   Shared Decision Making/Informed Consent   {Are you ordering a CV Procedure (e.g. stress test, cath, DCCV, TEE, etc)?   Press F2        :[id number]}    Signed, Jahaan Vanwagner Ardelle Lesches  12/03/2021 7:38 AM    [facility]

## 2021-12-07 ENCOUNTER — Encounter: Payer: Self-pay | Admitting: Medical

## 2022-04-04 ENCOUNTER — Other Ambulatory Visit: Payer: Self-pay | Admitting: Internal Medicine

## 2022-04-04 DIAGNOSIS — Z1231 Encounter for screening mammogram for malignant neoplasm of breast: Secondary | ICD-10-CM

## 2022-04-06 ENCOUNTER — Other Ambulatory Visit: Payer: Self-pay

## 2022-04-06 DIAGNOSIS — R634 Abnormal weight loss: Secondary | ICD-10-CM

## 2022-04-06 DIAGNOSIS — D649 Anemia, unspecified: Secondary | ICD-10-CM

## 2022-04-22 ENCOUNTER — Ambulatory Visit
Admission: RE | Admit: 2022-04-22 | Discharge: 2022-04-22 | Disposition: A | Discharge status: Home / Self Care | Payer: BC Managed Care – PPO | Source: Ambulatory Visit | Attending: Internal Medicine | Admitting: Internal Medicine

## 2022-04-22 DIAGNOSIS — D649 Anemia, unspecified: Secondary | ICD-10-CM | POA: Diagnosis present

## 2022-04-22 DIAGNOSIS — R634 Abnormal weight loss: Secondary | ICD-10-CM | POA: Insufficient documentation

## 2022-04-22 MED ORDER — IOHEXOL 300 MG/ML  SOLN
100.0000 mL | Freq: Once | INTRAMUSCULAR | Status: AC | PRN
  Administered 2022-04-22: 100 mL via INTRAVENOUS

## 2022-05-11 ENCOUNTER — Ambulatory Visit
Admission: RE | Admit: 2022-05-11 | Discharge: 2022-05-11 | Disposition: A | Discharge status: Home / Self Care | Payer: BC Managed Care – PPO | Source: Ambulatory Visit | Attending: Internal Medicine | Admitting: Internal Medicine

## 2022-05-11 DIAGNOSIS — Z1231 Encounter for screening mammogram for malignant neoplasm of breast: Secondary | ICD-10-CM | POA: Diagnosis present

## 2022-06-22 IMAGING — MG MM DIGITAL SCREENING BILAT W/ TOMO AND CAD
6 of 10 series · 6 of 30 positions shown · non-contrast
Comparison: Previous exam(s).

ACR Breast Density Category a: The breast tissue is almost entirely
fatty.

CLINICAL DATA: Screening.

EXAM:
DIGITAL SCREENING BILATERAL MAMMOGRAM WITH TOMOSYNTHESIS AND CAD
TECHNIQUE: Bilateral screening digital craniocaudal and mediolateral oblique
mammograms were obtained. Bilateral screening digital breast
tomosynthesis was performed. The images were evaluated with
computer-aided detection.

[L CC synth-2D]
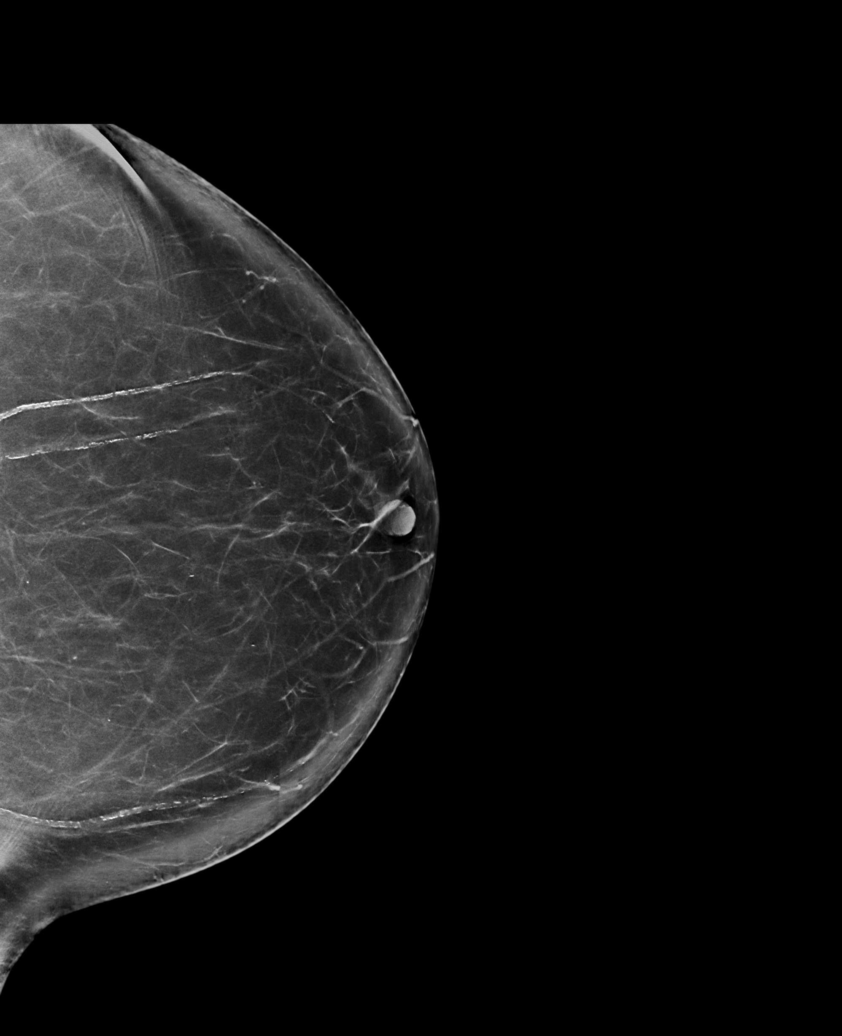

[L MLO synth-2D (1 of 2)]
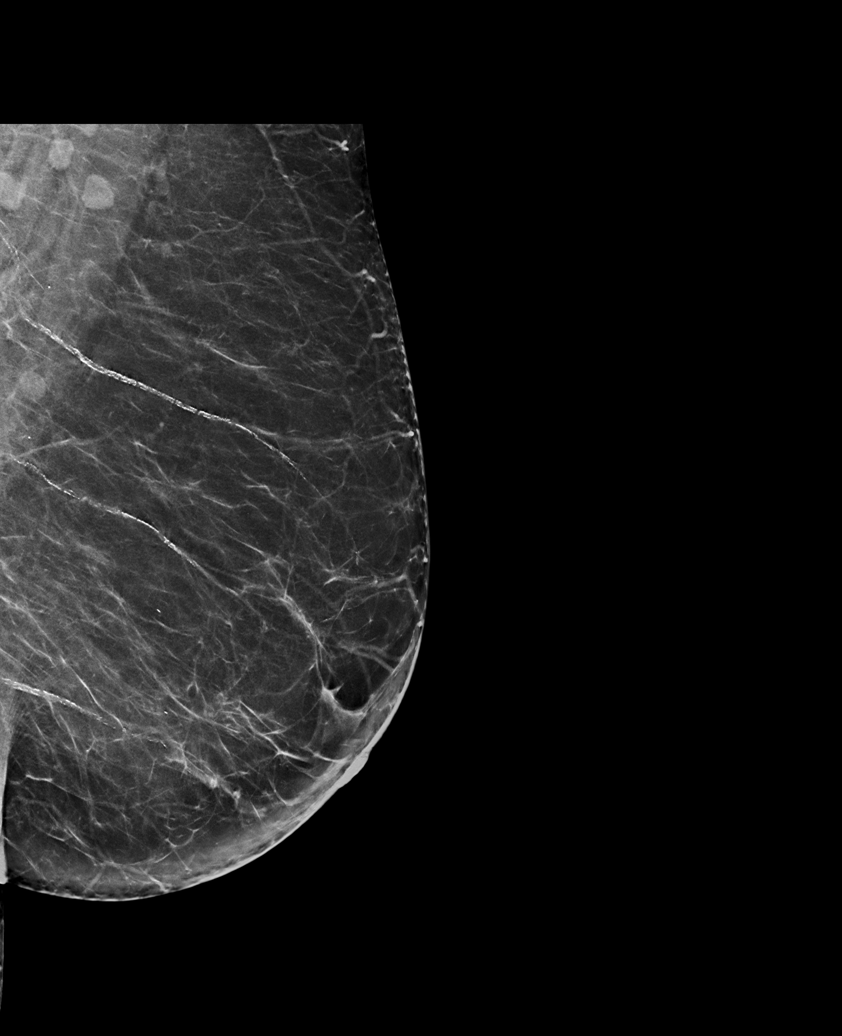

[R MLO synth-2D]
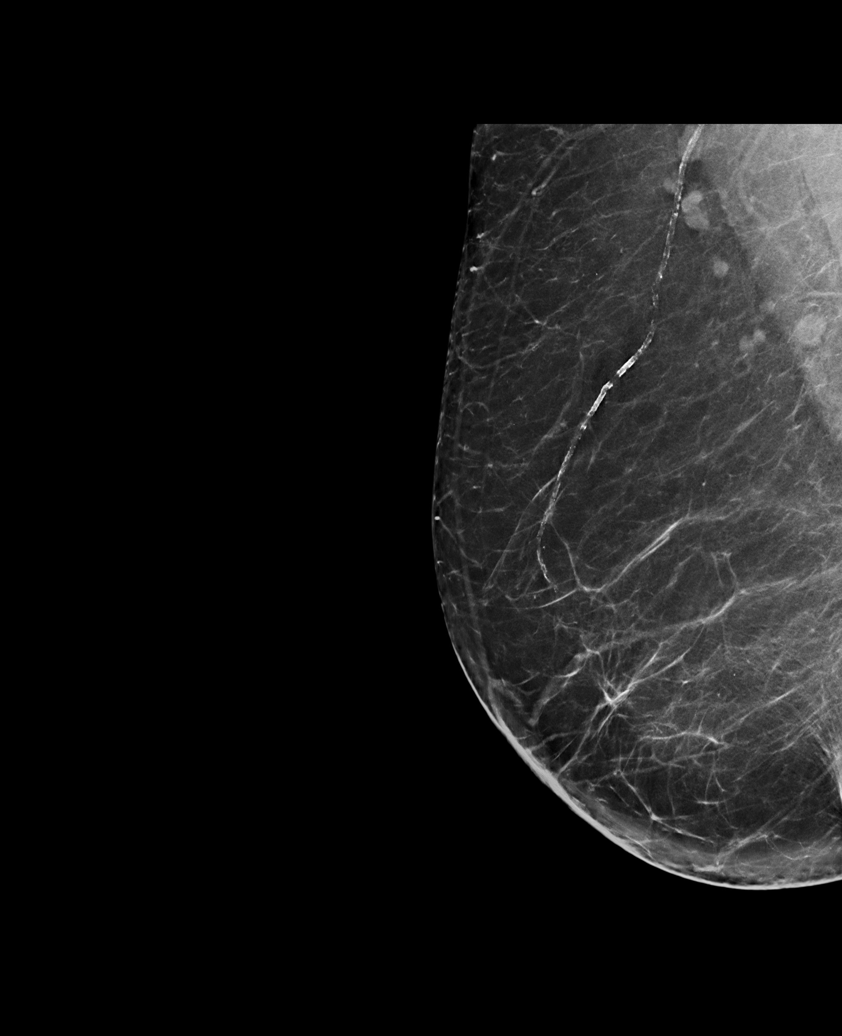

[R CC synth-2D]
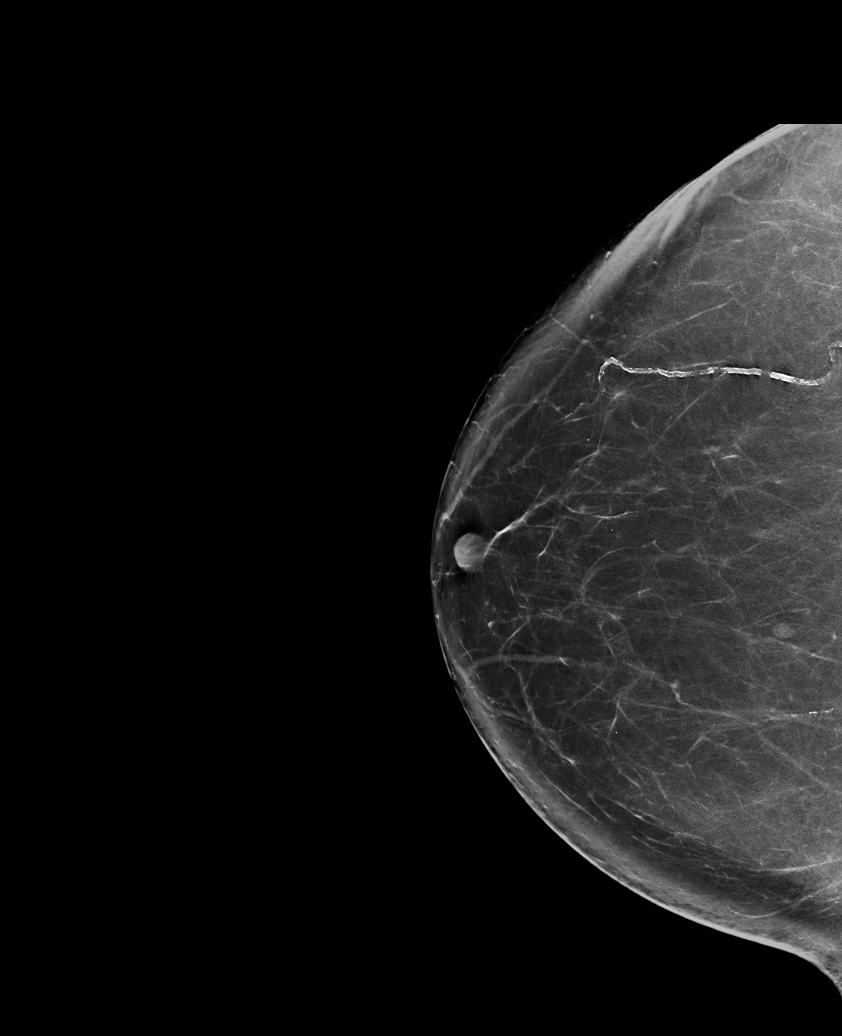

[L MLO synth-2D (2 of 2)]
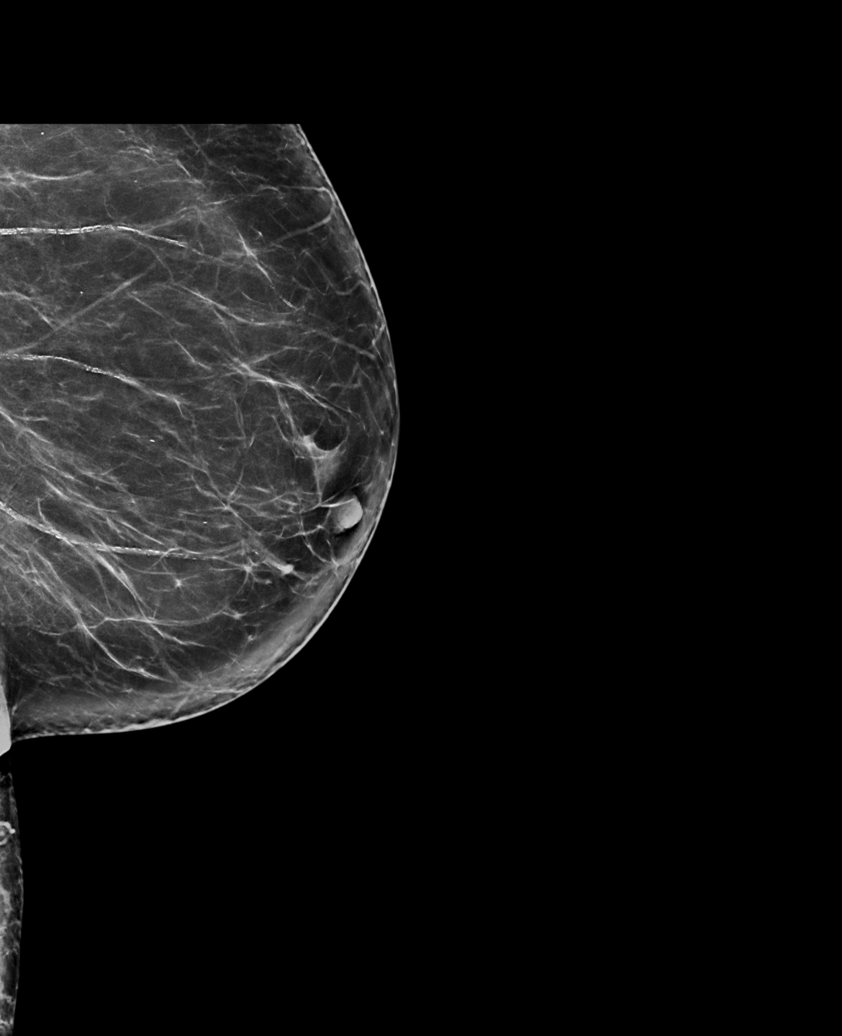

[R MLO tomo · tomo slice 45/90.0]
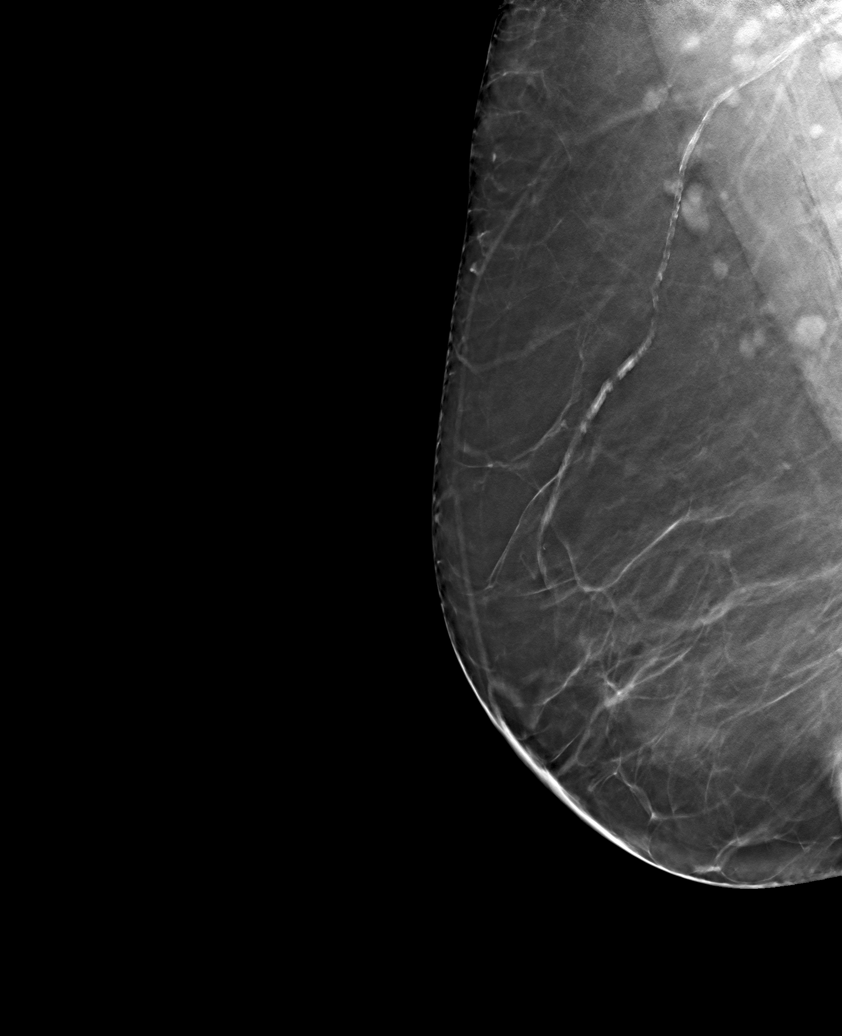

[6 of 30 positions shown; findings below may reference images not displayed]

FINDINGS: There are no findings suspicious for malignancy.
IMPRESSION: No mammographic evidence of malignancy. A result letter of this
screening mammogram will be mailed directly to the patient.

RECOMMENDATION:
Screening mammogram in one year. (Code:0E-3-N98)

BI-RADS CATEGORY  1: Negative.

## 2022-11-23 ENCOUNTER — Ambulatory Visit: Payer: BC Managed Care – PPO | Admitting: Anesthesiology

## 2022-11-23 ENCOUNTER — Encounter: Payer: Self-pay | Admitting: Internal Medicine

## 2022-11-23 ENCOUNTER — Encounter
Admission: RE | Disposition: A | Discharge status: Home / Self Care | Payer: Self-pay | Source: Home / Self Care | Attending: Internal Medicine

## 2022-11-23 ENCOUNTER — Ambulatory Visit
Admission: RE | Admit: 2022-11-23 | Discharge: 2022-11-23 | Disposition: A | Discharge status: Home / Self Care | Payer: BC Managed Care – PPO | Attending: Internal Medicine | Admitting: Internal Medicine

## 2022-11-23 DIAGNOSIS — I10 Essential (primary) hypertension: Secondary | ICD-10-CM | POA: Diagnosis not present

## 2022-11-23 DIAGNOSIS — Z1211 Encounter for screening for malignant neoplasm of colon: Secondary | ICD-10-CM | POA: Insufficient documentation

## 2022-11-23 DIAGNOSIS — K5730 Diverticulosis of large intestine without perforation or abscess without bleeding: Secondary | ICD-10-CM | POA: Insufficient documentation

## 2022-11-23 HISTORY — PX: COLONOSCOPY WITH PROPOFOL: SHX5780

## 2022-11-23 SURGERY — COLONOSCOPY WITH PROPOFOL
Anesthesia: General

## 2022-11-23 MED ORDER — DEXMEDETOMIDINE HCL IN NACL 80 MCG/20ML IV SOLN
INTRAVENOUS | Status: AC
  Filled 2022-11-23: qty 20

## 2022-11-23 MED ORDER — PROPOFOL 500 MG/50ML IV EMUL
INTRAVENOUS | Status: DC | PRN
  Administered 2022-11-23: 120 ug/kg/min via INTRAVENOUS

## 2022-11-23 MED ORDER — SODIUM CHLORIDE 0.9 % IV SOLN: INTRAVENOUS | Status: DC

## 2022-11-23 MED ORDER — DEXMEDETOMIDINE HCL IN NACL 80 MCG/20ML IV SOLN
INTRAVENOUS | Status: DC | PRN
  Administered 2022-11-23: 4 ug via INTRAVENOUS

## 2022-11-23 MED ORDER — STERILE WATER FOR IRRIGATION IR SOLN
Status: DC | PRN
  Administered 2022-11-23: 50 mL

## 2022-11-23 MED ORDER — PROPOFOL 10 MG/ML IV BOLUS
INTRAVENOUS | Status: DC | PRN
  Administered 2022-11-23: 80 mg via INTRAVENOUS
  Administered 2022-11-23: 20 mg via INTRAVENOUS

## 2022-11-23 NOTE — Op Note (Signed)
[facility] Patient Name: Abigail Freeman Procedure Date: 11/23/2022 3:13 PM MRN: [id number] Account #: [id number] Date of Birth: 15-Jul-1955 Admit Type: Outpatient Age: 67 Room: [facility] ENDO ROOM 2 Gender: Female Note Status: Finalized Instrument Name: Prentice Docker [id number] Procedure:             Colonoscopy Indications:           Screening for colorectal malignant neoplasm Providers:             Royce Macadamia K. Norma Fredrickson MD, MD Referring MD:          Barbette Reichmann, MD (Referring MD) Medicines:             Propofol per Anesthesia Complications:         No immediate complications. Procedure:             Pre-Anesthesia Assessment:                        - The risks and benefits of the procedure and the                         sedation options and risks were discussed with the                         patient. All questions were answered and informed                         consent was obtained.                        - Patient identification and proposed procedure were                         verified prior to the procedure by the nurse. The                         procedure was verified in the procedure room.                        - ASA Grade Assessment: III - A patient with severe                         systemic disease.                        - After reviewing the risks and benefits, the patient                         was deemed in satisfactory condition to undergo the                         procedure.                        After obtaining informed consent, the colonoscope was                         passed under direct vision. Throughout the procedure,                         the  patient's blood pressure, pulse, and oxygen                         saturations were monitored continuously. The                         Colonoscope was introduced through the anus and                         advanced to the the cecum, identified by appendiceal                          orifice and ileocecal valve. The colonoscopy was                         performed without difficulty. The patient tolerated                         the procedure well. The quality of the bowel                         preparation was good. The ileocecal valve, appendiceal                         orifice, and rectum were photographed. Findings:      The perianal and digital rectal examinations were normal. Pertinent       negatives include normal sphincter tone and no palpable rectal lesions.      A few small-mouthed diverticula were found in the sigmoid colon.      The exam was otherwise without abnormality on direct and retroflexion       views. Impression:            - Diverticulosis in the sigmoid colon.                        - The examination was otherwise normal on direct and                         retroflexion views.                        - No specimens collected. Recommendation:        - Patient has a contact number available for                         emergencies. The signs and symptoms of potential                         delayed complications were discussed with the patient.                         Return to normal activities tomorrow. Written                         discharge instructions were provided to the patient.                        - Resume previous diet.                        -  Continue present medications.                        - Repeat colonoscopy in 10 years for screening                         purposes.                        - Return to GI office PRN.                        - The findings and recommendations were discussed with                         the patient. Procedure Code(s):     --- Professional ---                        Z6109, Colorectal cancer screening; colonoscopy on                         individual not meeting criteria for high risk Diagnosis Code(s):     --- Professional ---                        K57.30, Diverticulosis of large  intestine without                         perforation or abscess without bleeding                        Z12.11, Encounter for screening for malignant neoplasm                         of colon CPT copyright 2022 American Medical Association. All rights reserved. The codes documented in this report are preliminary and upon coder review may  be revised to meet current compliance requirements. Stanton Kidney MD, MD 11/23/2022 3:37:14 PM This report has been signed electronically. Number of Addenda: 0 Note Initiated On: 11/23/2022 3:13 PM Scope Withdrawal Time: 0 hours 6 minutes 3 seconds  Total Procedure Duration: 0 hours 13 minutes 8 seconds  Estimated Blood Loss:  Estimated blood loss: none.      [facility]

## 2022-11-23 NOTE — Anesthesia Postprocedure Evaluation (Signed)
Anesthesia Post Note  Patient: Abigail Freeman  Procedure(s) Performed: COLONOSCOPY WITH PROPOFOL  Patient location during evaluation: Endoscopy Anesthesia Type: General Level of consciousness: awake and alert Pain management: pain level controlled Vital Signs Assessment: post-procedure vital signs reviewed and stable Respiratory status: spontaneous breathing, nonlabored ventilation, respiratory function stable and patient connected to nasal cannula oxygen Cardiovascular status: blood pressure returned to baseline and stable Postop Assessment: no apparent nausea or vomiting Anesthetic complications: no   No notable events documented.   Last Vitals:  Vitals:   11/23/22 1535 11/23/22 1545  BP: 101/75 129/81  Pulse: 86   Resp: 12 16  Temp: (!) 35.8 C   SpO2: 99% 99%    Last Pain:  Vitals:   11/23/22 1545  TempSrc:   PainSc: 0-No pain                 Corinda Gubler

## 2022-11-23 NOTE — Anesthesia Preprocedure Evaluation (Addendum)
Anesthesia Evaluation  Patient identified by MRN, date of birth, ID band Patient awake    Reviewed: Allergy & Precautions, NPO status , Patient's Chart, lab work & pertinent test results  Airway Mallampati: II  TM Distance: >3 FB Neck ROM: Full    Dental  (+) Upper Dentures, Lower Dentures   Pulmonary neg pulmonary ROS   Pulmonary exam normal breath sounds clear to auscultation       Cardiovascular Exercise Tolerance: Good hypertension, Pt. on medications negative cardio ROS Normal cardiovascular exam Rhythm:Regular Rate:Normal     Neuro/Psych negative neurological ROS  negative psych ROS   GI/Hepatic negative GI ROS, Neg liver ROS,,,  Endo/Other  negative endocrine ROS    Renal/GU negative Renal ROS  negative genitourinary   Musculoskeletal   Abdominal Normal abdominal exam  (+)   Peds negative pediatric ROS (+)  Hematology negative hematology ROS (+)   Anesthesia Other Findings Past Medical History: No date: Hypertension  Past Surgical History: No date: CHOLECYSTECTOMY  BMI    Body Mass Index: 28.32 kg/m      Reproductive/Obstetrics negative OB ROS                             Anesthesia Physical Anesthesia Plan  ASA: 2  Anesthesia Plan: General   Post-op Pain Management:    Induction: Intravenous  PONV Risk Score and Plan: Propofol infusion and TIVA  Airway Management Planned: Natural Airway and Nasal Cannula  Additional Equipment:   Intra-op Plan:   Post-operative Plan:   Informed Consent: I have reviewed the patients History and Physical, chart, labs and discussed the procedure including the risks, benefits and alternatives for the proposed anesthesia with the patient or authorized representative who has indicated his/her understanding and acceptance.     Dental Advisory Given  Plan Discussed with: Anesthesiologist, CRNA and Surgeon  Anesthesia Plan  Comments:        Anesthesia Quick Evaluation

## 2022-11-23 NOTE — Interval H&P Note (Signed)
History and Physical Interval Note:  11/23/2022 2:25 PM  Abigail Freeman  has presented today for surgery, with the diagnosis of V76.51 (ICD-9-CM) - Z12.11 (ICD-10-CM) - Colon cancer screening.  The various methods of treatment have been discussed with the patient and family. After consideration of risks, benefits and other options for treatment, the patient has consented to  Procedure(s): COLONOSCOPY WITH PROPOFOL (N/A) as a surgical intervention.  The patient's history has been reviewed, patient examined, no change in status, stable for surgery.  I have reviewed the patient's chart and labs.  Questions were answered to the patient's satisfaction.     [place], [place]

## 2022-11-23 NOTE — Transfer of Care (Signed)
Immediate Anesthesia Transfer of Care Note  Patient: Abigail Freeman  Procedure(s) Performed: COLONOSCOPY WITH PROPOFOL  Patient Location: PACU and Endoscopy Unit  Anesthesia Type:General  Level of Consciousness: drowsy and patient cooperative  Airway & Oxygen Therapy: Patient Spontanous Breathing  Post-op Assessment: Report given to RN and Post -op Vital signs reviewed and stable  Post vital signs: Reviewed and stable  Last Vitals:  Vitals Value Taken Time  BP 101/75 11/23/22 1535  Temp 35.8 C 11/23/22 1535  Pulse 87 11/23/22 1537  Resp 17 11/23/22 1537  SpO2 99 % 11/23/22 1537  Vitals shown include unfiled device data.  Last Pain:  Vitals:   11/23/22 1535  TempSrc:   PainSc: Asleep         Complications: No notable events documented.

## 2022-11-23 NOTE — H&P (Signed)
Outpatient short stay form Pre-procedure 11/23/2022 2:22 PM Abigail Freeman, M.D.  Primary Physician: Barbette Reichmann, M.D.  Reason for visit:  Colon cancer screening.  History of present illness:  Patient presents for colonoscopy for colon cancer screening. The patient denies complaints of abdominal pain, significant change in bowel habits, or rectal bleeding. Last CSY 11/2011 performed by Dr. Niel Hummer showed normal examined colon.  Denies family history of colon polyps or cancer.    Current Facility-Administered Medications:    0.9 %  sodium chloride infusion, , Intravenous, Continuous, [place], Boykin Nearing, MD, Last Rate: 20 mL/hr at 11/23/22 1409, New Bag at 11/23/22 1409  Medications Prior to Admission  Medication Sig Dispense Refill Last Dose   amLODipine (NORVASC) 5 MG tablet Take 5 mg by mouth daily.      cyanocobalamin 1000 MCG tablet Take by mouth daily.      hydrochlorothiazide (HYDRODIURIL) 25 MG tablet Take 1 tablet (25 mg total) by mouth daily. 90 tablet 0    ibuprofen (ADVIL) 200 MG tablet Take 400 mg by mouth every 6 (six) hours as needed.      olmesartan (BENICAR) 40 MG tablet Take 40 mg by mouth daily.      VITAMIN E PO Take by mouth daily in the afternoon.        No Known Allergies   Past Medical History:  Diagnosis Date   Hypertension     Review of systems:  Otherwise negative.    Physical Exam  Gen: Alert, oriented. Appears stated age.  HEENT: [place]/AT. PERRLA. Lungs: CTA, no wheezes. CV: RR nl S1, S2. Abd: soft, benign, no masses. BS+ Ext: No edema. Pulses 2+    Planned procedures: Proceed with colonoscopy. The patient understands the nature of the planned procedure, indications, risks, alternatives and potential complications including but not limited to bleeding, infection, perforation, damage to internal organs and possible oversedation/side effects from anesthesia. The patient agrees and gives consent to proceed.  Please refer to procedure  notes for findings, recommendations and patient disposition/instructions.     Abigail Freeman, M.D. Gastroenterology 11/23/2022  2:22 PM

## 2022-11-24 ENCOUNTER — Encounter: Payer: Self-pay | Admitting: Internal Medicine

## 2023-02-19 ENCOUNTER — Ambulatory Visit
Admission: EM | Admit: 2023-02-19 | Discharge: 2023-02-19 | Disposition: A | Discharge status: Home / Self Care | Payer: BC Managed Care – PPO | Attending: Emergency Medicine | Admitting: Emergency Medicine

## 2023-02-19 DIAGNOSIS — J101 Influenza due to other identified influenza virus with other respiratory manifestations: Secondary | ICD-10-CM | POA: Diagnosis not present

## 2023-02-19 DIAGNOSIS — R509 Fever, unspecified: Secondary | ICD-10-CM | POA: Diagnosis present

## 2023-02-19 LAB — RESP PANEL BY RT-PCR (RSV, FLU A&B, COVID)  RVPGX2
Influenza A by PCR: POSITIVE — AB
Influenza B by PCR: NEGATIVE
Resp Syncytial Virus by PCR: NEGATIVE
SARS Coronavirus 2 by RT PCR: NEGATIVE

## 2023-02-19 MED ORDER — IBUPROFEN 600 MG PO TABS: 600.0000 mg | ORAL_TABLET | Freq: Three times a day (TID) | ORAL | 0 refills | Status: AC | PRN

## 2023-02-19 MED ORDER — FLUTICASONE PROPIONATE 50 MCG/ACT NA SUSP: 2.0000 | Freq: Every day | NASAL | 0 refills | Status: AC

## 2023-02-19 MED ORDER — OSELTAMIVIR PHOSPHATE 75 MG PO CAPS: 75.0000 mg | ORAL_CAPSULE | Freq: Two times a day (BID) | ORAL | 0 refills | Status: AC

## 2023-02-19 NOTE — ED Triage Notes (Signed)
Pt is with her daughter  Pt declined an interpreter.   Pt c/o headache and nasal congestion.  Pt states that she does not know her medications

## 2023-02-19 NOTE — ED Triage Notes (Signed)
Pt states that she has taken Acetif today for her symptoms

## 2023-02-19 NOTE — Discharge Instructions (Addendum)
su prueba de influenza es positiva. Termine el tamiflu, incluso si se siente mejor. Tome 600 mg de ibuprofeno con 1000 mg de tylenol tres veces al [employer]. empuje lquidos que contengan electrolitos como pedialyte, gatorade o lquido intravenoso. Flonase, irrigacin nasal con solucin salina con un enjuague sinusal de [facility] y agua destilada tantas veces como desee. Puede regresar a trabajar si no tiene fiebre durante 24 horas sin usar Tylenol o ibuprofeno y se Risk manager.

## 2023-02-19 NOTE — ED Provider Notes (Signed)
HPI  SUBJECTIVE:  Abigail Freeman is a 68 y.o. female who presents with nasal congestion, headache, sore throat, feeling feverish with chills, rhinorrhea, occasional cough starting yesterday.  Reports some nausea earlier today, but no vomiting, diarrhea, abdominal pain.  No body aches, postnasal drip, wheezing, shortness of breath.  No known influenza exposure.  She got the flu vaccine.  She took Tylenol with improvement in her symptoms.  Last dose was within 6 hours of evaluation.  No aggravating factors.  She is able to sleep at night without waking up coughing.  She has a past medical history of hypertension, E. coli UTI with sepsis, glaucoma.  No history of chronic kidney disease.  [facility].  Daughter acting as Nurse, learning disability.  They declined third-party medical translator.  Past Medical History:  Diagnosis Date   Hypertension     Past Surgical History:  Procedure Laterality Date   CHOLECYSTECTOMY     COLONOSCOPY WITH PROPOFOL N/A 11/23/2022   Procedure: COLONOSCOPY WITH PROPOFOL;  Surgeon: Toledo, Boykin Nearing, MD;  Location: [facility] ENDOSCOPY;  Service: Gastroenterology;  Laterality: N/A;    Family History  Problem Relation Age of Onset   Hypertension Mother    Stomach cancer Father    Pancreatic cancer Brother    Breast cancer Neg Hx     Social History   Tobacco Use   Smoking status: Never   Smokeless tobacco: Never  Vaping Use   Vaping status: Never Used  Substance Use Topics   Alcohol use: No   Drug use: Never    No current facility-administered medications for this encounter.  Current Outpatient Medications:    fluticasone (FLONASE) 50 MCG/ACT nasal spray, Place 2 sprays into both nostrils daily., Disp: 16 g, Rfl: 0   oseltamivir (TAMIFLU) 75 MG capsule, Take 1 capsule (75 mg total) by mouth 2 (two) times daily. X 5 days, Disp: 10 capsule, Rfl: 0   amLODipine (NORVASC) 5 MG tablet, Take 5 mg by mouth daily., Disp: , Rfl:    cyanocobalamin 1000 MCG tablet, Take by  mouth daily., Disp: , Rfl:    hydrochlorothiazide (HYDRODIURIL) 25 MG tablet, Take 1 tablet (25 mg total) by mouth daily., Disp: 90 tablet, Rfl: 0   olmesartan (BENICAR) 40 MG tablet, Take 40 mg by mouth daily., Disp: , Rfl:    VITAMIN E PO, Take by mouth daily in the afternoon., Disp: , Rfl:   No Known Allergies   ROS  As noted in HPI.   Physical Exam  BP 106/74 (BP Location: Left Arm)   Pulse (!) 116   Temp 99 F (37.2 C) (Oral)   Ht 5' (1.524 m)   Wt 67.6 kg   SpO2 93%   BMI 29.10 kg/m   Constitutional: Well developed, well nourished, no acute distress Eyes:  EOMI, conjunctiva normal bilaterally HENT: Normocephalic, atraumatic,mucus membranes moist.  Positive nasal congestion.  No maxillary, frontal sinus tenderness.  Normal oropharynx. Neck: Positive cervical lymphadenopathy Respiratory: Normal inspiratory effort, lungs clear bilaterally Cardiovascular: Regular tachycardia, no murmurs rubs or gallops GI: nondistended skin: No rash, skin intact Musculoskeletal: no deformities Neurologic: Alert & oriented x 3, no focal neuro deficits Psychiatric: Speech and behavior appropriate   ED Course   Medications - No data to display  Orders Placed This Encounter  Procedures   Resp panel by RT-PCR (RSV, Flu A&B, Covid) Anterior Nasal Swab    Standing Status:   Standing    Number of Occurrences:   1   Airborne and Contact  precautions    Standing Status:   Standing    Number of Occurrences:   1    Results for orders placed or performed during the hospital encounter of 02/19/23 (from the past 24 hours)  Resp panel by RT-PCR (RSV, Flu A&B, Covid) Anterior Nasal Swab     Status: Abnormal   Collection Time: 02/19/23  3:43 PM   Specimen: Anterior Nasal Swab  Result Value Ref Range   SARS Coronavirus 2 by RT PCR NEGATIVE NEGATIVE   Influenza A by PCR POSITIVE (A) NEGATIVE   Influenza B by PCR NEGATIVE NEGATIVE   Resp Syncytial Virus by PCR NEGATIVE NEGATIVE   No results  found.  ED Clinical Impression  1. Influenza A      ED Assessment/Plan     Patient presents with influenza A.  Will send home with Tamiflu as symptoms started yesterday.  Also sending home with Tylenol/ibuprofen, saline nasal irrigation, Flonase.  Push electrolyte containing fluids.  Work note to return on Thursday.  She is off tomorrow.  She may return sooner if feeling better and afebrile for 24 hours without the use of fever reducing drugs.  Discussed labs,MDM, treatment plan, and plan for follow-up with daughter and patient. Discussed sn/sx that should prompt return to the ED. they agree with plan.   Meds ordered this encounter  Medications   fluticasone (FLONASE) 50 MCG/ACT nasal spray    Sig: Place 2 sprays into both nostrils daily.    Dispense:  16 g    Refill:  0   oseltamivir (TAMIFLU) 75 MG capsule    Sig: Take 1 capsule (75 mg total) by mouth 2 (two) times daily. X 5 days    Dispense:  10 capsule    Refill:  0      *This clinic note was created using Scientist, clinical (histocompatibility and immunogenetics). Therefore, there may be occasional mistakes despite careful proofreading.  ?    Domenick Gong, MD 02/20/23 1143

## 2023-04-18 ENCOUNTER — Other Ambulatory Visit: Payer: Self-pay | Admitting: Internal Medicine

## 2023-04-18 DIAGNOSIS — Z1231 Encounter for screening mammogram for malignant neoplasm of breast: Secondary | ICD-10-CM

## 2023-05-15 ENCOUNTER — Ambulatory Visit
Admission: RE | Admit: 2023-05-15 | Discharge: 2023-05-15 | Disposition: A | Discharge status: Home / Self Care | Source: Ambulatory Visit | Attending: Internal Medicine | Admitting: Internal Medicine

## 2023-05-15 DIAGNOSIS — Z1231 Encounter for screening mammogram for malignant neoplasm of breast: Secondary | ICD-10-CM | POA: Insufficient documentation
